# Patient Record
Sex: Female | Born: 1955 | Hispanic: Yes | Marital: Single | Smoking: Never smoker
Health system: Southern US, Community
[De-identification: ages and names within clinical notes are randomized; demographics above are authoritative.]

## PROBLEM LIST (undated history)

## (undated) DIAGNOSIS — M1990 Unspecified osteoarthritis, unspecified site: Secondary | ICD-10-CM

## (undated) DIAGNOSIS — E119 Type 2 diabetes mellitus without complications: Secondary | ICD-10-CM

## (undated) DIAGNOSIS — Z972 Presence of dental prosthetic device (complete) (partial): Secondary | ICD-10-CM

## (undated) DIAGNOSIS — E785 Hyperlipidemia, unspecified: Secondary | ICD-10-CM

## (undated) DIAGNOSIS — K4090 Unilateral inguinal hernia, without obstruction or gangrene, not specified as recurrent: Secondary | ICD-10-CM

## (undated) DIAGNOSIS — N8110 Cystocele, unspecified: Secondary | ICD-10-CM

## (undated) HISTORY — DX: Hyperlipidemia, unspecified: E78.5

## (undated) HISTORY — DX: Unspecified osteoarthritis, unspecified site: M19.90

## (undated) HISTORY — PX: BUNIONECTOMY: SHX129

## (undated) HISTORY — PX: BREAST SURGERY: SHX581

## (undated) HISTORY — DX: Cystocele, unspecified: N81.10

## (undated) HISTORY — DX: Type 2 diabetes mellitus without complications: E11.9

---

## 2006-08-27 HISTORY — PX: AUGMENTATION MAMMAPLASTY: SUR837

## 2009-08-31 ENCOUNTER — Ambulatory Visit: Payer: Self-pay | Admitting: Podiatry

## 2009-09-09 ENCOUNTER — Ambulatory Visit: Payer: Self-pay | Admitting: Podiatry

## 2009-12-26 ENCOUNTER — Ambulatory Visit: Payer: Self-pay | Admitting: Podiatry

## 2009-12-30 ENCOUNTER — Ambulatory Visit: Payer: Self-pay | Admitting: Podiatry

## 2011-10-10 ENCOUNTER — Ambulatory Visit: Payer: Self-pay

## 2011-11-02 DIAGNOSIS — N8111 Cystocele, midline: Secondary | ICD-10-CM | POA: Insufficient documentation

## 2011-11-02 DIAGNOSIS — N812 Incomplete uterovaginal prolapse: Secondary | ICD-10-CM | POA: Insufficient documentation

## 2011-11-02 DIAGNOSIS — N816 Rectocele: Secondary | ICD-10-CM | POA: Insufficient documentation

## 2011-11-02 DIAGNOSIS — N393 Stress incontinence (female) (male): Secondary | ICD-10-CM | POA: Insufficient documentation

## 2011-11-13 DIAGNOSIS — Z9889 Other specified postprocedural states: Secondary | ICD-10-CM | POA: Insufficient documentation

## 2011-12-28 DIAGNOSIS — Z09 Encounter for follow-up examination after completed treatment for conditions other than malignant neoplasm: Secondary | ICD-10-CM | POA: Insufficient documentation

## 2012-08-27 HISTORY — PX: ABDOMINAL HYSTERECTOMY: SHX81

## 2013-12-24 ENCOUNTER — Ambulatory Visit: Payer: Self-pay | Admitting: Obstetrics and Gynecology

## 2014-01-01 ENCOUNTER — Ambulatory Visit: Payer: Self-pay | Admitting: Obstetrics and Gynecology

## 2014-01-08 DIAGNOSIS — F32A Depression, unspecified: Secondary | ICD-10-CM | POA: Insufficient documentation

## 2014-01-08 DIAGNOSIS — M545 Low back pain, unspecified: Secondary | ICD-10-CM | POA: Insufficient documentation

## 2014-06-25 ENCOUNTER — Ambulatory Visit: Payer: Self-pay | Admitting: Orthopedic Surgery

## 2014-12-07 DIAGNOSIS — M1990 Unspecified osteoarthritis, unspecified site: Secondary | ICD-10-CM | POA: Insufficient documentation

## 2014-12-07 DIAGNOSIS — Z0000 Encounter for general adult medical examination without abnormal findings: Secondary | ICD-10-CM | POA: Insufficient documentation

## 2014-12-20 DIAGNOSIS — E785 Hyperlipidemia, unspecified: Secondary | ICD-10-CM | POA: Insufficient documentation

## 2015-01-25 DIAGNOSIS — M2550 Pain in unspecified joint: Secondary | ICD-10-CM | POA: Insufficient documentation

## 2015-02-01 DIAGNOSIS — G609 Hereditary and idiopathic neuropathy, unspecified: Secondary | ICD-10-CM | POA: Insufficient documentation

## 2015-02-02 ENCOUNTER — Ambulatory Visit: Payer: Self-pay

## 2015-02-02 ENCOUNTER — Ambulatory Visit: Payer: Self-pay | Attending: Oncology

## 2015-02-02 ENCOUNTER — Ambulatory Visit
Admission: RE | Admit: 2015-02-02 | Discharge: 2015-02-02 | Disposition: A | Discharge status: Home / Self Care | Payer: Self-pay | Source: Ambulatory Visit | Attending: Oncology | Admitting: Oncology

## 2015-02-02 ENCOUNTER — Other Ambulatory Visit: Payer: Self-pay | Admitting: Oncology

## 2015-02-02 VITALS — BP 116/76 | HR 70 | Temp 98.0°F | Resp 16

## 2015-02-02 DIAGNOSIS — Z0000 Encounter for general adult medical examination without abnormal findings: Secondary | ICD-10-CM

## 2015-02-02 NOTE — Progress Notes (Signed)
Subjective:     Patient ID: Leah Harris, female   DOB: July 03, 1956, 59 y.o.   MRN: [id number]  HPI   Review of Systems     Objective:   Physical Exam  Pulmonary/Chest: Right breast exhibits no inverted nipple, no mass, no nipple discharge, no skin change and no tenderness. Left breast exhibits no inverted nipple, no mass, no nipple discharge, no skin change and no tenderness. Breasts are symmetrical.    Bilateral breast implants       Assessment:    Patient presents for [place] [facility] visitPatient screened, and meets [facility] eligibility.  Patient does not have insurance, Medicare or Medicaid.  Handout given on Affordable Care Act.CBE unremarkable.  Patient has bilateral implants x 11 years.Instructed patient on breast self-exam using teach back method. Maritza Afandor interpreted exam.     Plan:

## 2015-02-03 ENCOUNTER — Other Ambulatory Visit: Payer: Self-pay | Admitting: Oncology

## 2015-02-03 ENCOUNTER — Other Ambulatory Visit: Payer: Self-pay

## 2015-02-03 DIAGNOSIS — R928 Other abnormal and inconclusive findings on diagnostic imaging of breast: Secondary | ICD-10-CM

## 2015-02-03 DIAGNOSIS — N63 Unspecified lump in unspecified breast: Secondary | ICD-10-CM

## 2015-02-10 ENCOUNTER — Other Ambulatory Visit: Payer: Self-pay | Admitting: *Deleted

## 2015-02-10 DIAGNOSIS — N63 Unspecified lump in unspecified breast: Secondary | ICD-10-CM

## 2015-02-16 ENCOUNTER — Ambulatory Visit
Admission: RE | Admit: 2015-02-16 | Discharge: 2015-02-16 | Disposition: A | Discharge status: Home / Self Care | Payer: Self-pay | Source: Ambulatory Visit | Attending: Oncology | Admitting: Oncology

## 2015-02-16 ENCOUNTER — Ambulatory Visit: Payer: Self-pay

## 2015-02-16 DIAGNOSIS — N63 Unspecified lump in unspecified breast: Secondary | ICD-10-CM

## 2015-02-16 NOTE — Progress Notes (Signed)
Letter mailed from [facility] to notify of normal mammogram results.  Patient to return in one year for annual screening.   To return in one year for screening.  Copy to [facility].

## 2015-03-03 ENCOUNTER — Ambulatory Visit
Admission: RE | Admit: 2015-03-03 | Discharge: 2015-03-03 | Disposition: A | Discharge status: Home / Self Care | Payer: Self-pay | Source: Ambulatory Visit | Attending: Physician Assistant | Admitting: Physician Assistant

## 2015-03-03 ENCOUNTER — Other Ambulatory Visit: Payer: Self-pay | Admitting: Physician Assistant

## 2015-03-03 DIAGNOSIS — M1990 Unspecified osteoarthritis, unspecified site: Secondary | ICD-10-CM

## 2015-03-03 DIAGNOSIS — M2550 Pain in unspecified joint: Secondary | ICD-10-CM

## 2015-03-03 DIAGNOSIS — M25562 Pain in left knee: Secondary | ICD-10-CM | POA: Insufficient documentation

## 2015-03-03 DIAGNOSIS — M1712 Unilateral primary osteoarthritis, left knee: Secondary | ICD-10-CM | POA: Insufficient documentation

## 2015-03-10 ENCOUNTER — Encounter: Payer: Self-pay | Admitting: Podiatry

## 2015-03-10 ENCOUNTER — Ambulatory Visit (INDEPENDENT_AMBULATORY_CARE_PROVIDER_SITE_OTHER): Payer: Self-pay | Admitting: Podiatry

## 2015-03-10 ENCOUNTER — Ambulatory Visit (INDEPENDENT_AMBULATORY_CARE_PROVIDER_SITE_OTHER): Payer: Self-pay

## 2015-03-10 VITALS — BP 114/62 | HR 78 | Resp 16

## 2015-03-10 DIAGNOSIS — M79673 Pain in unspecified foot: Secondary | ICD-10-CM

## 2015-03-10 DIAGNOSIS — G629 Polyneuropathy, unspecified: Secondary | ICD-10-CM

## 2015-03-10 NOTE — Progress Notes (Signed)
   Subjective:    Patient ID: Leah Harris, female    DOB: 1956-07-28, 59 y.o.   MRN: [id number]  HPI  59 year old female presents the office today with a Spanish interpreter for complaints of bilateral foot/leg pain. She describes a burning tight pain to both of her feet and legs. She states that her legs and feet do hurt when she walks although she has no pain at rest. She previously has been placed on gabapentin by her primary care physician. She is currently taking 300 mg 3 times a day. She denies any side effects the medication although she states is not helping that much. She states that most of her pain is at night. No other complaints at this time. She denies any history of injury or trauma. His been ongoing for several months and unchanged.   Review of Systems  All other systems reviewed and are negative.      Objective:   Physical Exam AAO x3, NAD DP/PT pulses palpable bilaterally, CRT less than 3 seconds Protective sensation intact with Simms Weinstein monofilament, vibratory sensation intact, Achilles tendon reflex intact. Subjectively there is numbness to both feet and distal legs.  No R no areas of tenderness to bilateral lower extremities at rest.  There is a decrease in medial arch height upon weightbearing. MMT 5/5, ROM WNL.  No open lesions or pre-ulcerative lesions.  No overlying edema, erythema, increase in warmth to bilateral lower extremities.  No pain with calf compression, swelling, warmth, erythema bilaterally.      Assessment & Plan:   59 year old female with what appears to be neuropathy type symptoms bilateral lower studies. There is no pain at today's appointment. -X-rays were obtained and reviewed with the patient.  -Treatment options discussed including all alternatives, risks, and complications -I discussed with the patient had increased her gabapentin slowly up to 600 mg 3 times a day. She verbally understood this. -She has apparently been checked  diabetes and other causes of neuropathy. Recommended  To follow-up with her primary care physician. -Follow-up 4 weeks or sooner if any problems arise. In the meantime, encouraged to call the office with any questions, concerns, change in symptoms.   Celesta Gentile, DPM

## 2015-03-17 ENCOUNTER — Ambulatory Visit
Admission: RE | Admit: 2015-03-17 | Discharge: 2015-03-17 | Disposition: A | Discharge status: Home / Self Care | Payer: Self-pay | Source: Ambulatory Visit | Attending: Family Medicine | Admitting: Family Medicine

## 2015-03-17 ENCOUNTER — Other Ambulatory Visit: Payer: Self-pay | Admitting: Family Medicine

## 2015-03-17 DIAGNOSIS — M2550 Pain in unspecified joint: Secondary | ICD-10-CM

## 2015-03-17 DIAGNOSIS — M25461 Effusion, right knee: Secondary | ICD-10-CM | POA: Insufficient documentation

## 2015-03-17 DIAGNOSIS — M1711 Unilateral primary osteoarthritis, right knee: Secondary | ICD-10-CM | POA: Insufficient documentation

## 2015-03-17 DIAGNOSIS — Z780 Asymptomatic menopausal state: Secondary | ICD-10-CM | POA: Insufficient documentation

## 2015-03-17 DIAGNOSIS — M8580 Other specified disorders of bone density and structure, unspecified site: Secondary | ICD-10-CM | POA: Insufficient documentation

## 2015-03-30 ENCOUNTER — Encounter: Payer: Self-pay | Admitting: *Deleted

## 2015-03-31 DIAGNOSIS — R600 Localized edema: Secondary | ICD-10-CM | POA: Insufficient documentation

## 2015-04-07 ENCOUNTER — Encounter: Payer: Self-pay | Admitting: Neurology

## 2015-04-07 ENCOUNTER — Ambulatory Visit (INDEPENDENT_AMBULATORY_CARE_PROVIDER_SITE_OTHER): Payer: Self-pay | Admitting: Neurology

## 2015-04-07 ENCOUNTER — Ambulatory Visit: Payer: Self-pay | Admitting: Podiatry

## 2015-04-07 VITALS — BP 110/70 | HR 82 | Ht 59.0 in | Wt 164.6 lb

## 2015-04-07 DIAGNOSIS — M25562 Pain in left knee: Secondary | ICD-10-CM

## 2015-04-07 DIAGNOSIS — M79605 Pain in left leg: Secondary | ICD-10-CM

## 2015-04-07 DIAGNOSIS — M79604 Pain in right leg: Secondary | ICD-10-CM

## 2015-04-07 NOTE — Patient Instructions (Addendum)
Your symptoms and exam does NOT suggest a problem of your nervous system.  For this reason, the medication gabapentin is probably not the best for your pain.  Since gabapentin is not helping, reduce to 600mg  twice daily for 3 days, then 600mg  at bedtime for three days then STOP.  Please follow-up with your orthopaedic surgeon for evaluation of knee pain, as pain is most likely referred from this.

## 2015-04-07 NOTE — Progress Notes (Signed)
[facility] Clinic Note - Initial Visit   Date: 04/07/2015  Leah Harris MRN: [id number] DOB: 1956/02/15   Dear Dr. Carlynn Herald:  Thank you for your kind referral of Leah Harris for consultation of bilateral feet paresthesias. Although her history is well known to you, please allow [place] to reiterate it for the purpose of our medical record. The patient was accompanied to the clinic by Spanish Interpretor.    History of Present Illness: Leah Harris is a 59 y.o. right-handed Hispanic female with bilateral leg edema and knee pain presenting for evaluation of bilateral leg pain.    Since early 2016, she started experiencing swelling and severe pain of the left knee.  She feels as if the knee bones are grinding on each other when she is walking.  Pain is worse when she is walking or at night time.  She has burning and achy sensation of the entire lower leg, sparing the feet.  Rest improves her pain and swelling.  She has pain with bending her knees and on the left side, she is unable to flex the knee completely.  She tends to favor the right leg when walking because of her knee pain, but lately has been experiencing similar pain on right, too.  She has seen an orthopeadic physician who mentioned that her cartilage is reduced which is contributing to her discomfort.  She had no numbness/tingling, electrical/shock-like pain of her legs.  Denies any feet pain, paresthesias, or weakness. She walks independently and has not had any falls.   She was started on gabapentin and increased to 600mg  TID for her burning pain, but there has been no relief of symptoms, but it does make her sleepy.      Out-side paper records, electronic medical record, and images have been reviewed where available and summarized as:  XR knee left 03/03/2015: 1. No acute findings. 2. 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  No past medical history on file.  Past Surgical History  Procedure Laterality Date  . Augmentation mammaplasty Bilateral 2008    saline     Medications:  Outpatient Encounter Prescriptions as of 04/07/2015  Medication Sig Note  . naproxen (NAPROSYN) 500 MG tablet Take 500 mg by mouth 2 (two) times daily with a meal.   . acetaminophen (TYLENOL) 500 MG tablet Take by mouth. 03/10/2015: Received from: [place]  . furosemide (LASIX) 20 MG tablet Take by mouth. 03/10/2015: Received from: [place]  . gabapentin (NEURONTIN) 300 MG capsule Take by mouth 3 (three) times daily.  03/10/2015: Received from: [place]  . [DISCONTINUED] atorvastatin (LIPITOR) 10 MG tablet Take by mouth. 03/10/2015: Received from: [place]   No facility-administered encounter medications on file as of 04/07/2015.     Allergies: No Known Allergies  Family History: Family History  Problem Relation Age of Onset  . Hypertension Mother     Social History: Social History  Substance Use Topics  . Smoking status: Never Smoker   . Smokeless tobacco: Never Used  . Alcohol Use: No   Social History   Social History Narrative   Lives alone in a one story home.  Has 7 children.  Works as a Training and development officer in [employer].  Education: 4th grade.    Review of Systems:  CONSTITUTIONAL: No fevers, chills, night sweats, or weight loss.   EYES: No visual changes or eye pain ENT: No hearing changes.  No history of nose bleeds.   RESPIRATORY: No cough, wheezing and shortness of breath.   CARDIOVASCULAR: Negative for chest pain, and palpitations.   GI: Negative for abdominal discomfort, blood in stools or black stools.  No recent change in bowel habits.   GU:  No history of incontinence.   MUSCLOSKELETAL: +history of joint pain or swelling.  No myalgias.   SKIN: Negative for lesions, rash, and itching.   HEMATOLOGY/ONCOLOGY: Negative for  prolonged bleeding, bruising easily, and swollen nodes.  No history of cancer.   ENDOCRINE: Negative for cold or heat intolerance, polydipsia or goiter.   PSYCH:  No depression or anxiety symptoms.   NEURO: As Above.   Vital Signs:  BP 110/70 mmHg  Pulse 82  Ht 4\' 11"  (1.499 m)  Wt 164 lb 9 oz (74.645 kg)  BMI 33.22 kg/m2  SpO2 95%  LMP  (LMP Unknown)   General Medical Exam:   General:  Well appearing, comfortable.   Eyes/ENT: see cranial nerve examination.   Neck: No masses appreciated.  Full range of motion without tenderness.  No carotid bruits. Respiratory:  Clear to auscultation, good air entry bilaterally.   Cardiac:  Regular rate and rhythm, no murmur.   Extremities:  Mild edema of the knee on the left and bilateral lower legs.  Knee flexion ROM is reduced on the left.   Skin:  No rashes or lesions.  Neurological Exam: MENTAL STATUS including orientation to time, place, person, recent and remote memory, attention span and concentration, language, and fund of knowledge is normal.  Speech is not dysarthric, able to speak broken english.  CRANIAL NERVES: II:  No visual field defects.  Unremarkable fundi.   III-IV-VI: Pupils equal round and reactive to light.  Normal conjugate, extra-ocular eye movements in all directions of gaze.  No nystagmus.  No ptosis.   V:  Normal facial sensation.     VII:  Normal facial symmetry and movements.  VIII:  Normal hearing and vestibular function.   IX-X:  Normal palatal movement.   XI:  Normal shoulder shrug and head rotation.   XII:  Normal tongue strength and range of motion, no deviation or fasciculation.  MOTOR:  No atrophy, fasciculations or abnormal movements.  No pronator drift.  Tone is normal.    Right Upper Extremity:    Left Upper Extremity:    Deltoid  5/5   Deltoid  5/5   Biceps  5/5   Biceps  5/5   Triceps  5/5   Triceps  5/5   Wrist extensors  5/5   Wrist extensors  5/5   Wrist flexors  5/5   Wrist flexors  5/5     Finger extensors  5/5   Finger extensors  5/5   Finger flexors  5/5   Finger flexors  5/5   Dorsal interossei  5/5   Dorsal interossei  5/5   Abductor pollicis  5/5   Abductor pollicis  5/5   Tone (Ashworth scale)  0  Tone (Ashworth scale)  0   Right Lower Extremity:    Left Lower Extremity:    Hip flexors  5/5   Hip flexors  5/5   Hip extensors  5/5   Hip extensors  5/5   Knee flexors  5/5   Knee flexors  5/5   Knee extensors  5/5   Knee extensors  5/5   Dorsiflexors  5/5   Dorsiflexors  5/5   Plantarflexors  5/5   Plantarflexors  5/5   Toe extensors  5/5   Toe extensors  5/5   Toe flexors  5/5   Toe flexors  5/5   Tone (Ashworth scale)  0  Tone (Ashworth scale)  0   MSRs:  Right                                                                 Left brachioradialis 2+  brachioradialis 2+  biceps 2+  biceps 2+  triceps 2+  triceps 2+  patellar 2+  patellar 2+  ankle jerk 1+  ankle jerk 1+  Hoffman no  Hoffman no  plantar response down  plantar response down   SENSORY:  Normal and symmetric perception of light touch, pinprick, vibration, and proprioception.  Romberg's sign absent.   COORDINATION/GAIT: Normal finger-to- nose-finger and heel-to-shin.  Intact rapid alternating movements bilaterally.  Gait is antalgic favoring the right leg.   IMPRESSION: Ms. Ficken is a 59 year-old female presenting for evaluation of bilateral leg pain.  Pain is achy involves the lower leg from the knee down to the ankle and spares the feet, worse on the left.  I do not find any evidence of neuropathy or radiculopathy based on her history or exam and do not feel that NCS/EMG is warranted. It would be extremely unusual for neuropathy to spare the feet and further her sensation to all modalities is intact.  Because she does not have neuropathic pain, I would recommend tapering the gabapentin to stop it.    Her pain is most likely orthopeadic in origin and referred down her leg.  She has marked  reduced range of motion with left leg flexion.  Alternatively, the burning bone pain after walking, may be suggestive of claudication.  She needs to follow-up with her orthopaedic physician for her knee pain.  I further recommended that she use a cane to off set her weight when walking as she is favoring the right leg to prevent further injury to the right knee.   Return to clinic as needed   The duration of this appointment visit was 35 minutes of face-to-face time with the patient.  Greater than 50% of this time was spent in counseling, explanation of diagnosis, planning of further management, and coordination of care.   Thank you for allowing me to participate in patient's care.  If I can answer any additional questions, I would be pleased to do so.    Sincerely,    Allecia Bells K. Posey Pronto, DO

## 2015-04-07 NOTE — Progress Notes (Signed)
Note faxed.

## 2015-05-11 DIAGNOSIS — M170 Bilateral primary osteoarthritis of knee: Secondary | ICD-10-CM | POA: Insufficient documentation

## 2015-10-14 IMAGING — CR DG KNEE COMPLETE 4+V*L*
1 series · 4 of 4 positions shown · non-contrast
Comparison: None.

CLINICAL DATA: Left knee pain for the past 2 years. Worsening
recently. No known injury. Initial encounter.

EXAM:
LEFT KNEE - COMPLETE 4+ VIEW

[Series 1: dg knee complete 4 views left · 0.14mm/px · 4 of 4 slices shown]
[im 1/4]
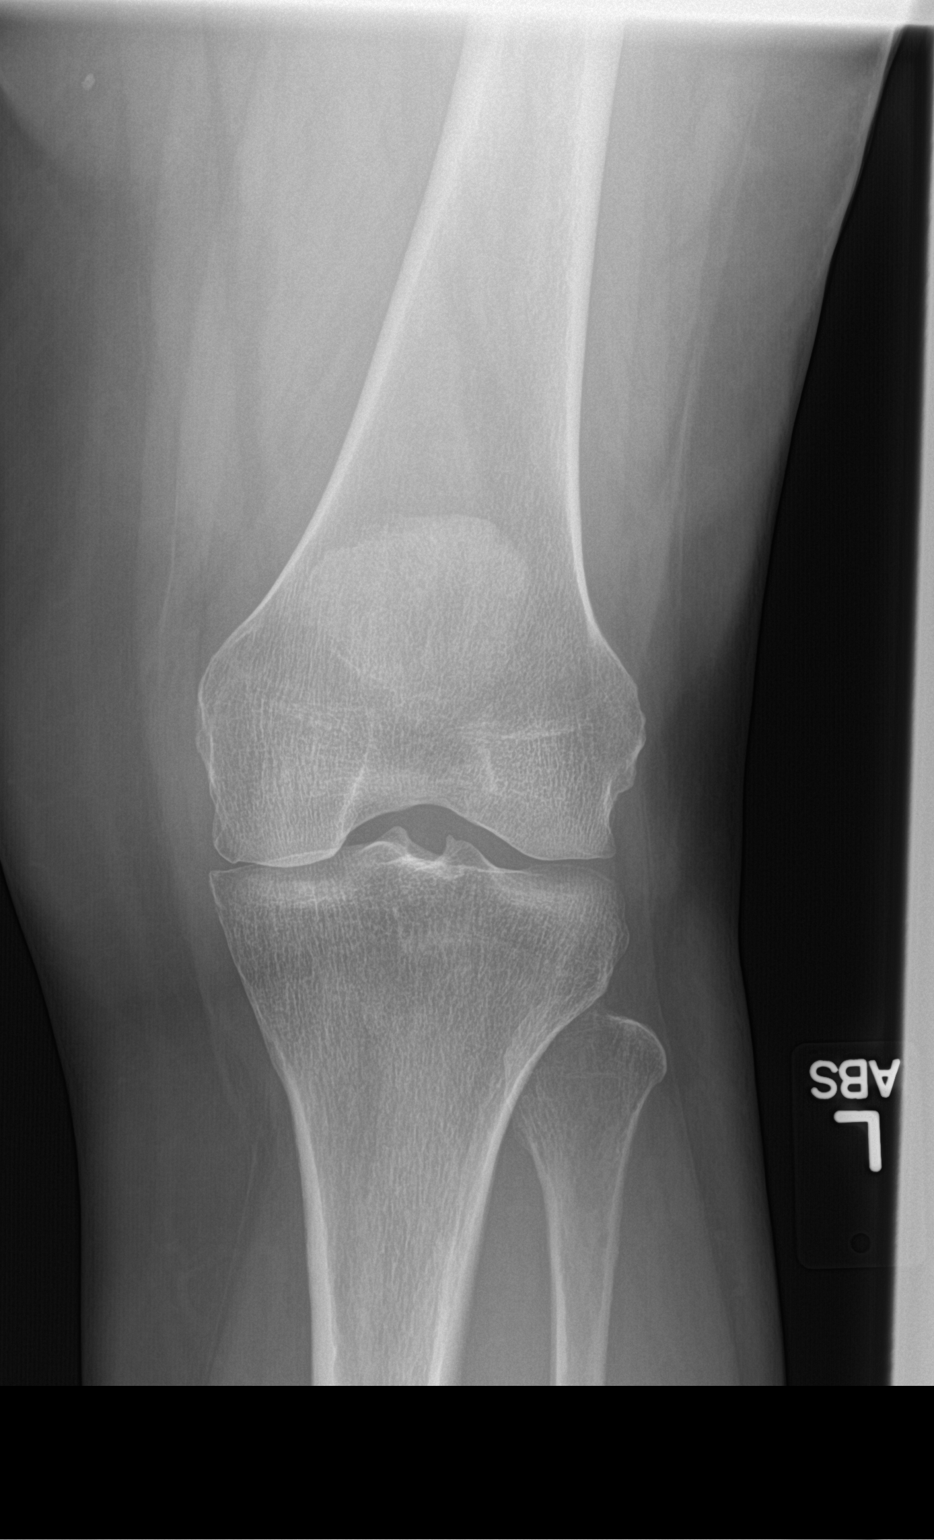
[im 2/4]
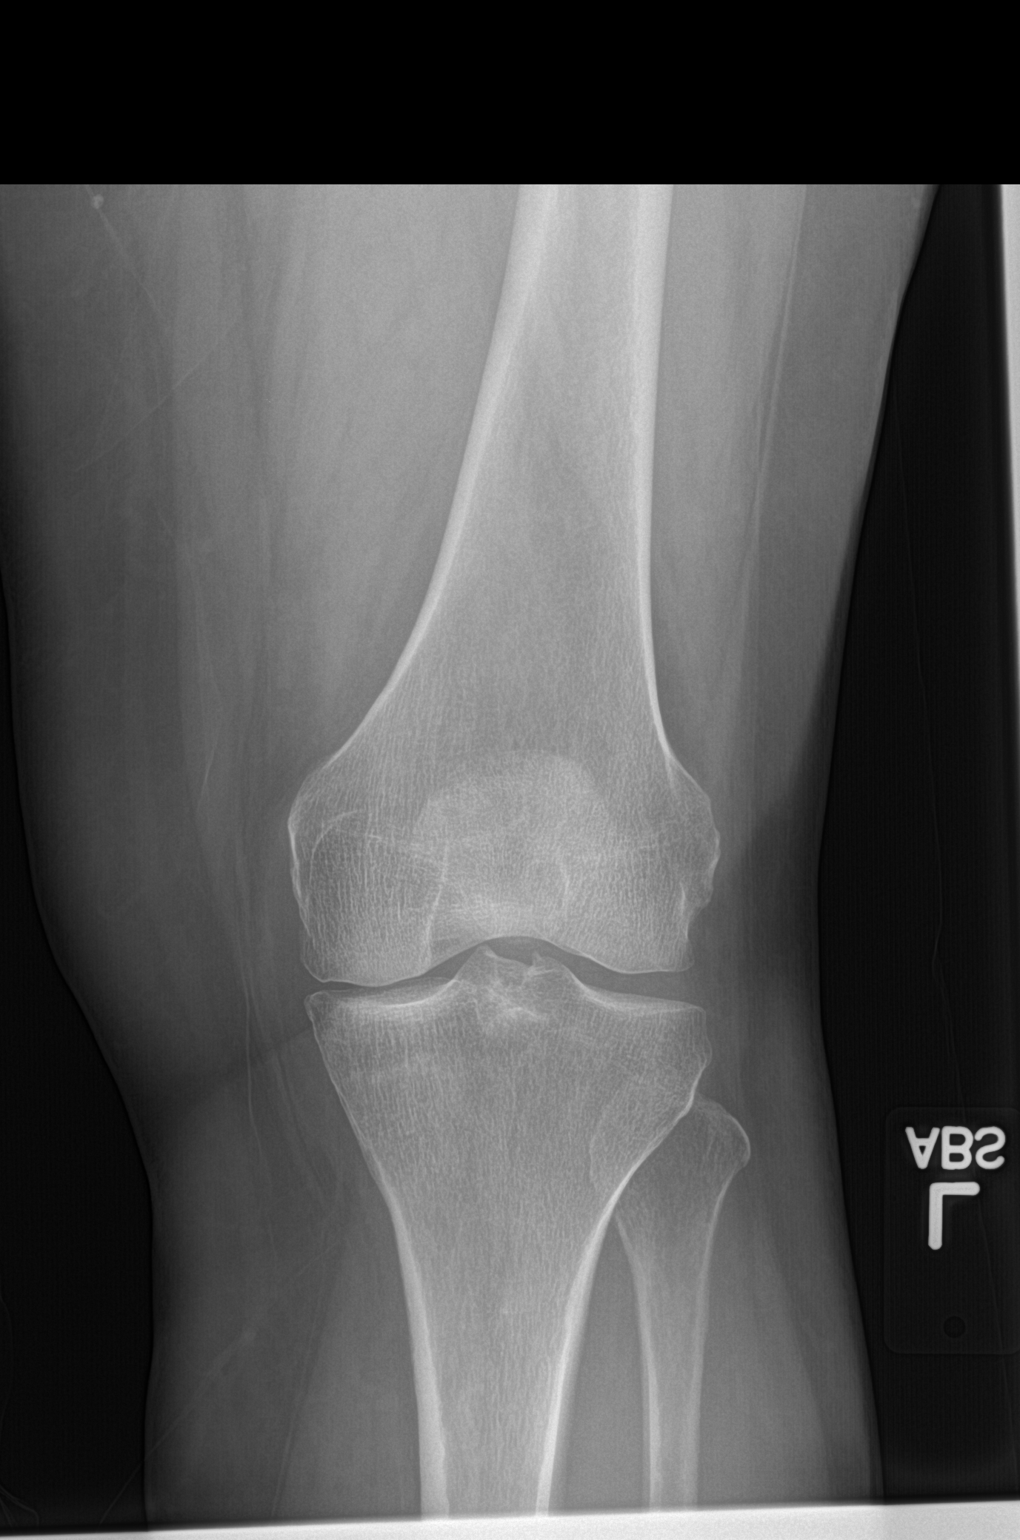
[im 3/4]
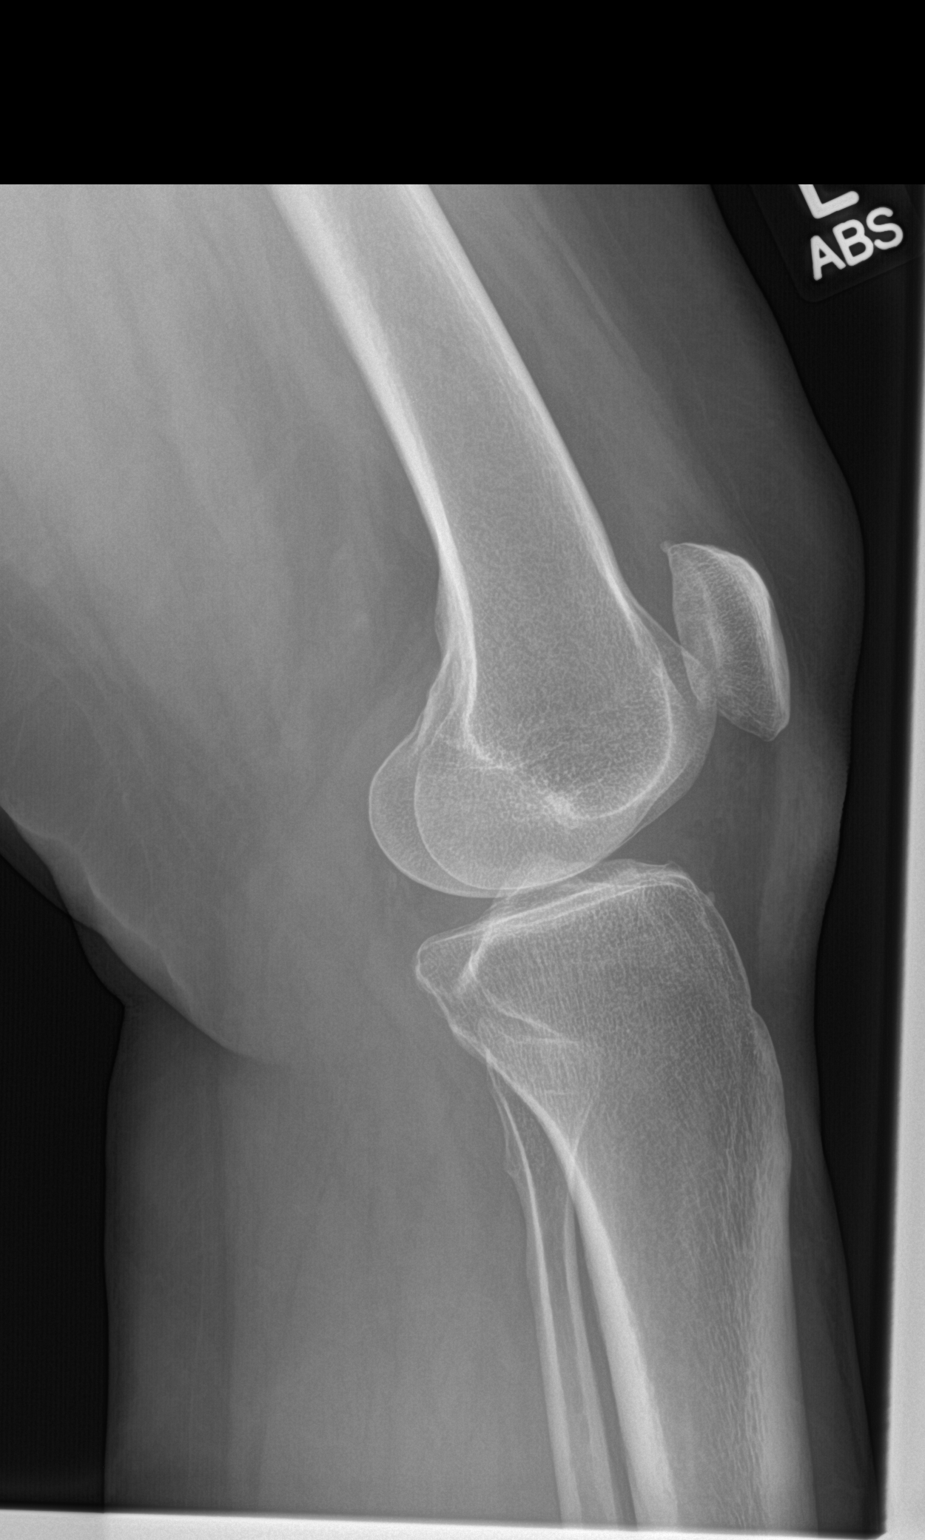
[im 4/4]
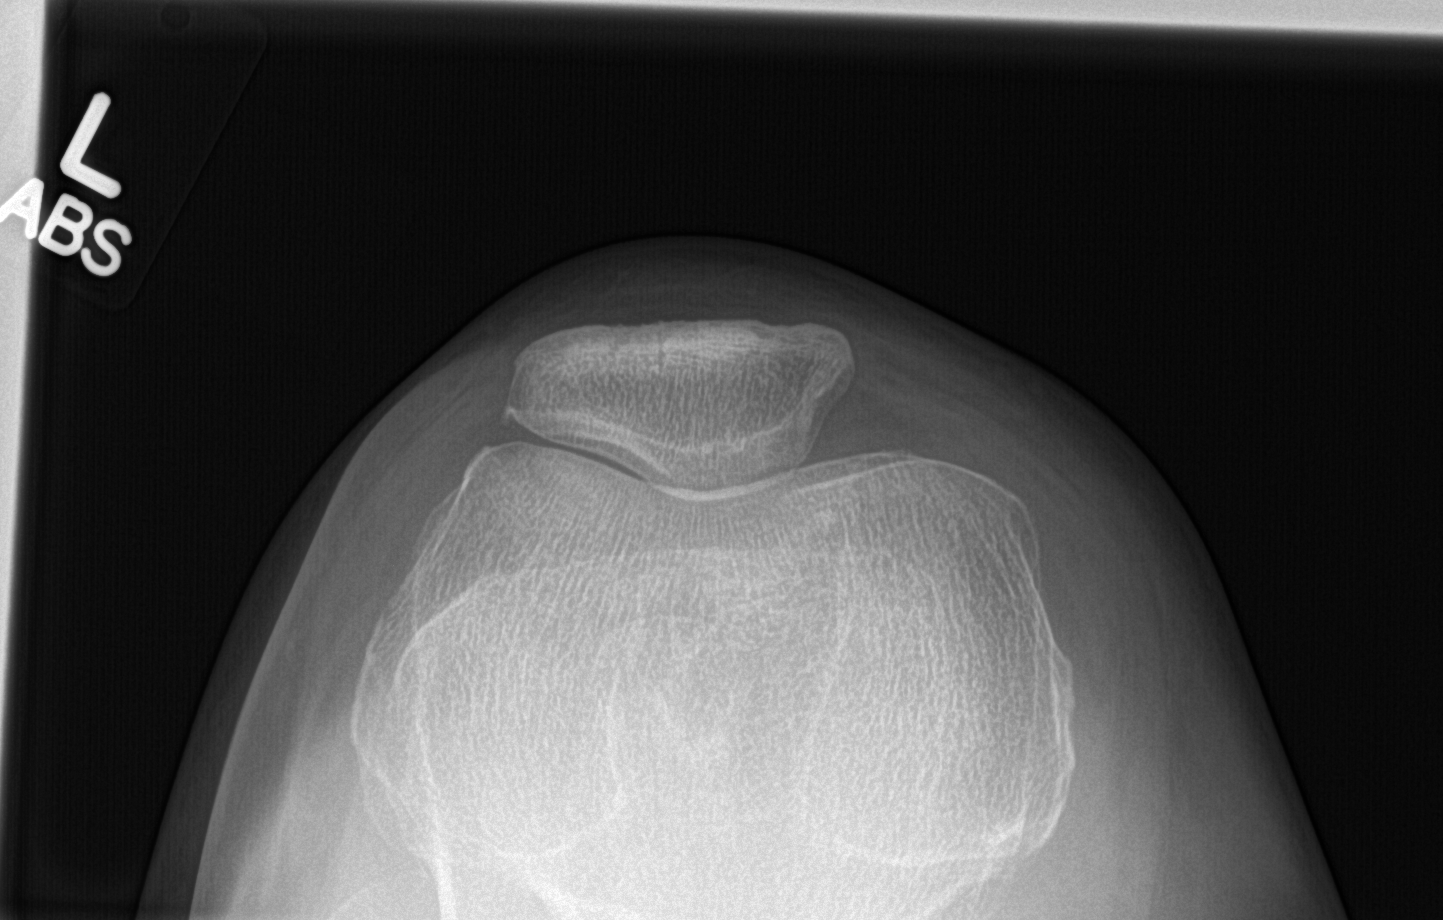

[4 of 4 positions shown; findings below may reference images not displayed]

FINDINGS: No fracture or dislocation mild-to-moderate tricompartmental
degenerative change of the knee, worse within the medial compartment
with joint space loss, subchondral sclerosis osteophytosis. There is
minimal spurring the tibial spines. No evidence of
chondrocalcinosis. There is an enthesopathic change involving the
superior pole the patella. There is a well-circumscribed punctate
(approximately 2 mm) presumed loose body overlying the anterior
inferior aspect of the knee joint space without a discrete donor
site identified. No joint effusion. A punctate dermal calcification
overlies the medial aspect of the mid thigh. Regional soft tissues
appear normal.
IMPRESSION: 1. No acute findings.
2. Mild-to-moderate tricompartmental degenerative change of the
knee, worse within the medial compartment.

## 2015-10-28 IMAGING — CR DG KNEE COMPLETE 4+V*R*
4 series · 4 of 4 positions shown · non-contrast
Comparison: None.

CLINICAL DATA: Knee pain.  No known injury.  Initial evaluation.

EXAM:
RIGHT KNEE - COMPLETE 4+ VIEW

[knee ap (1 of 2)]
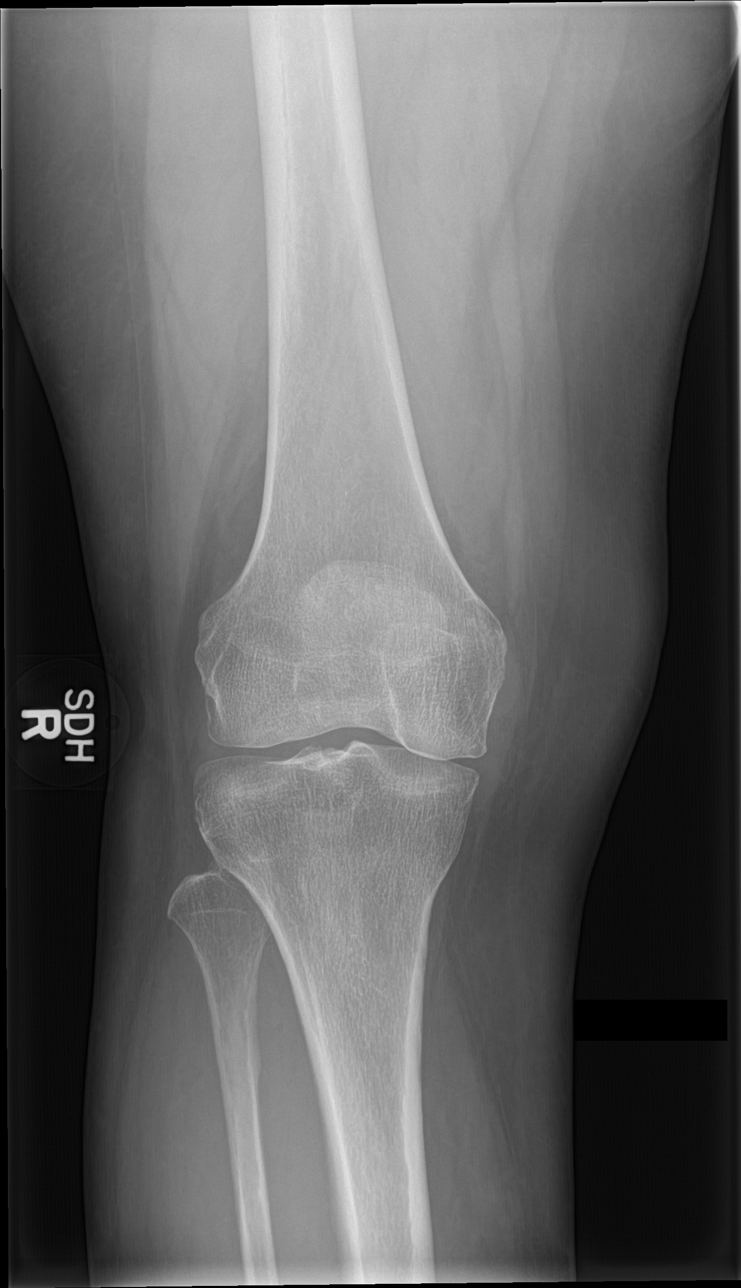

[knee lat]
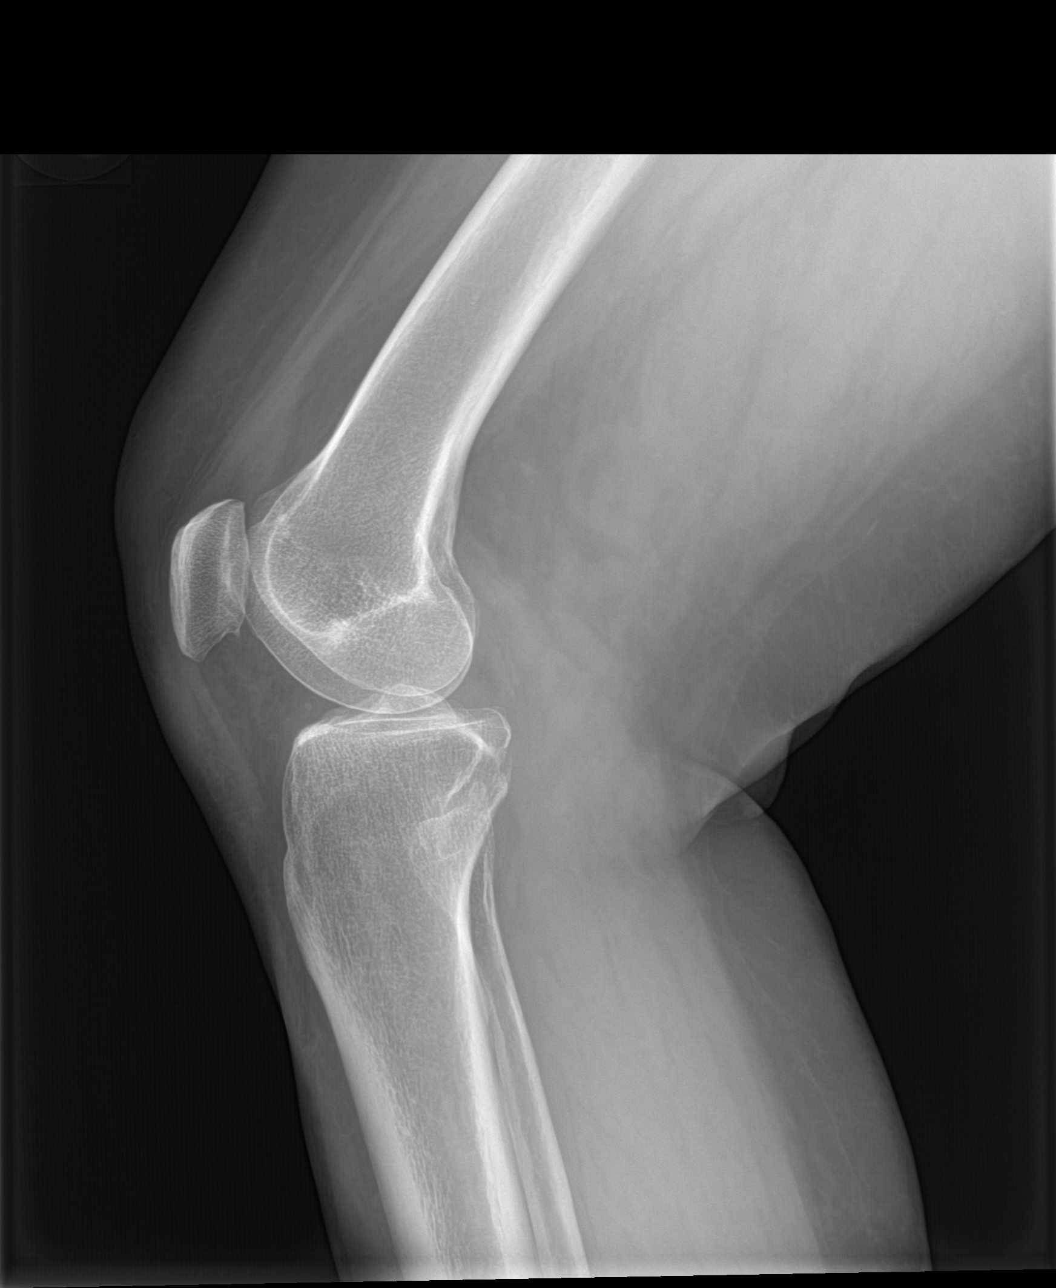

[sunrise]
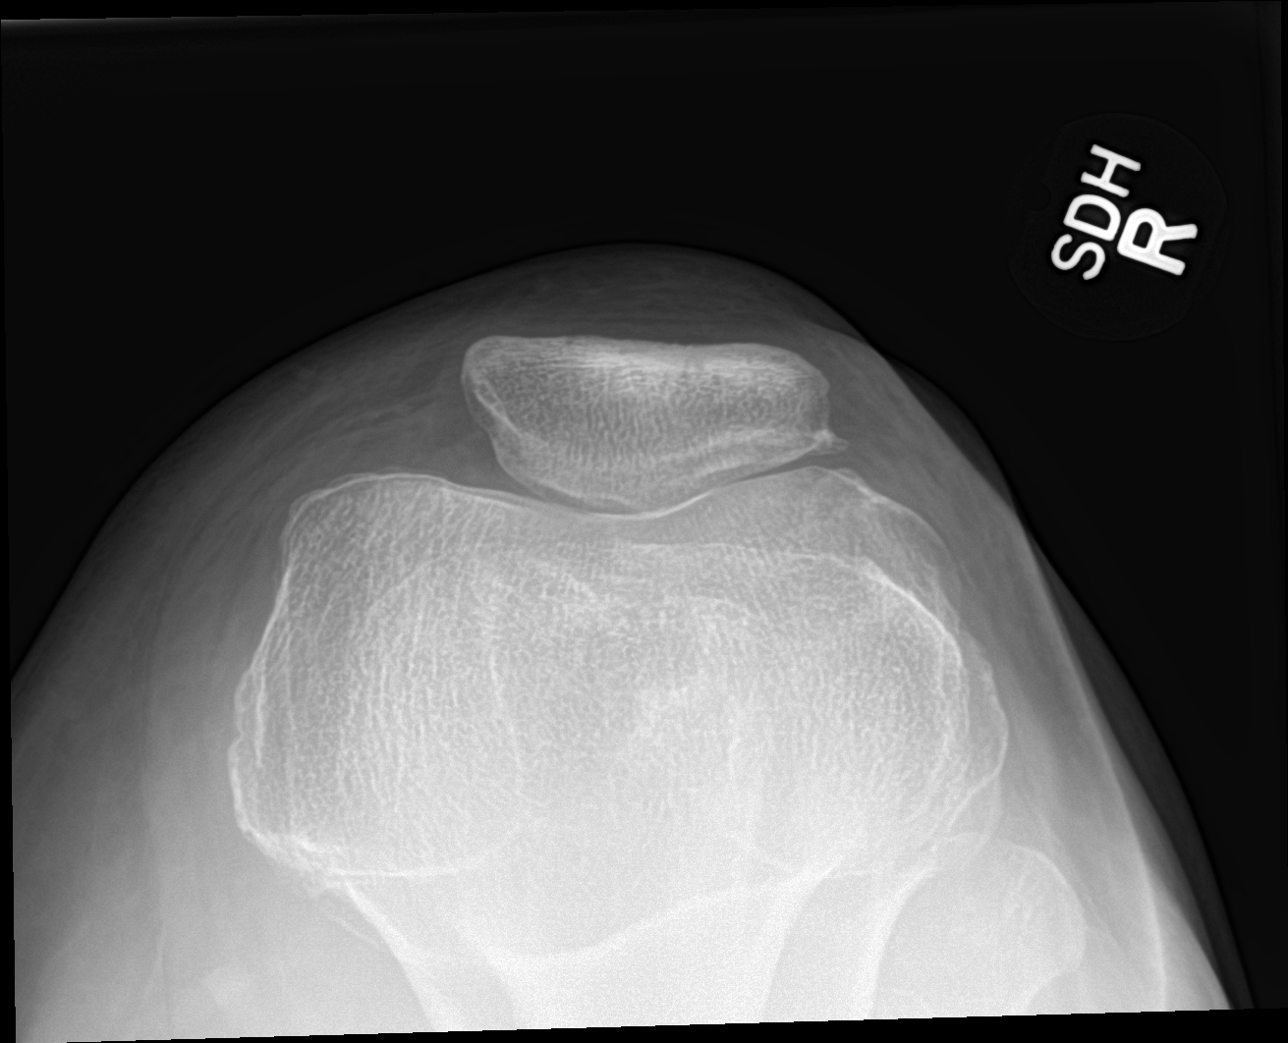

[knee ap (2 of 2)]
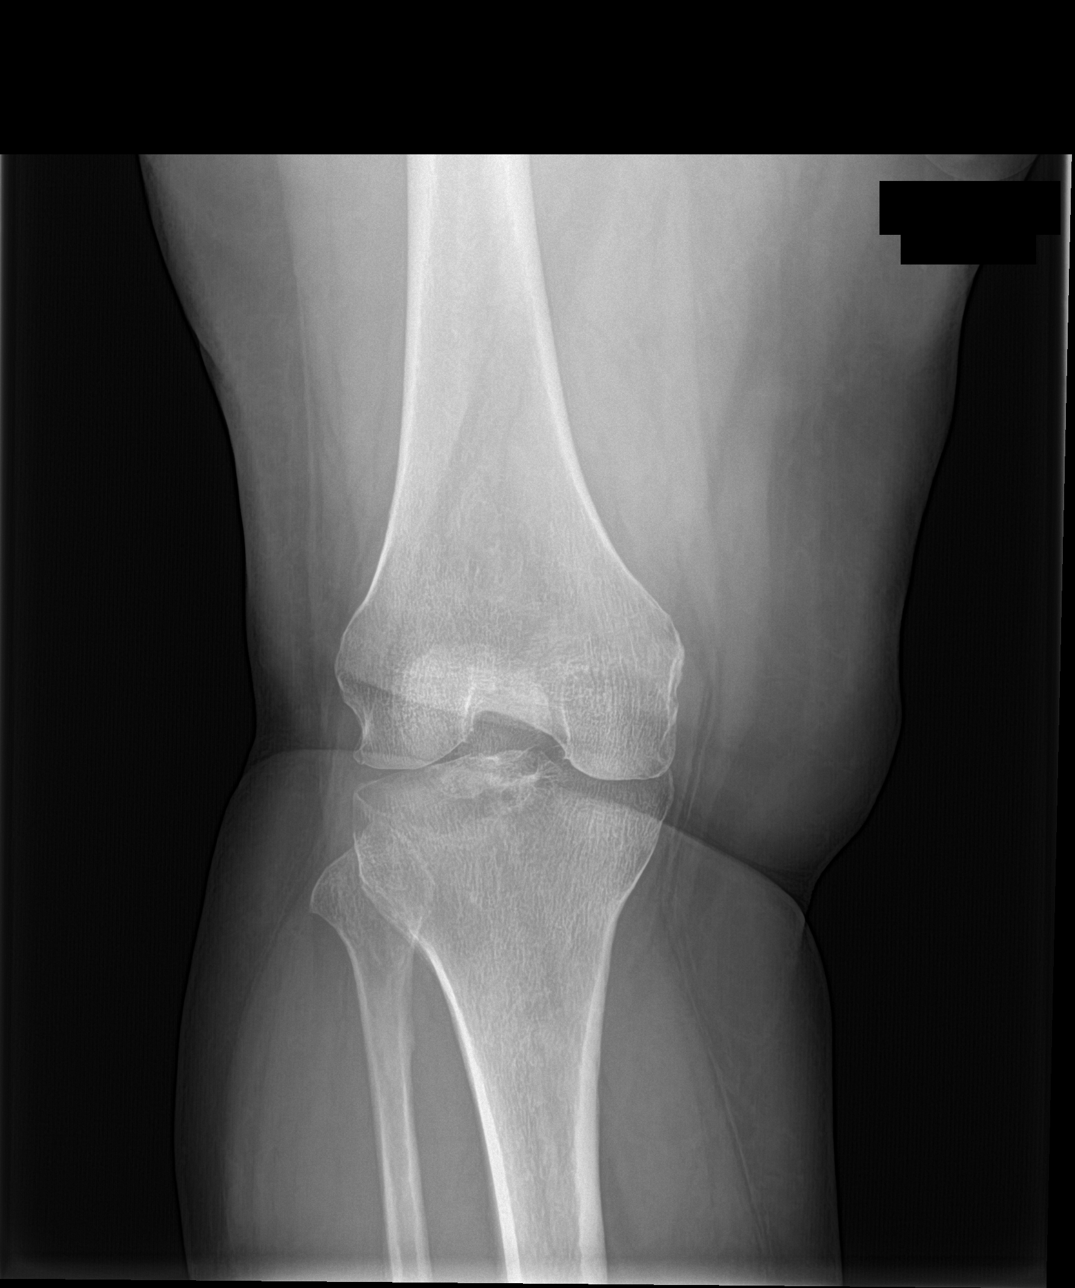

[4 of 4 positions shown; findings below may reference images not displayed]

FINDINGS: Diffuse osteopenia. Mild tricompartment degenerative change present.
Degenerative changes most prominent about the patellofemoral
compartment. No acute bony abnormality identified. No evidence of
fracture or dislocation. Tiny knee joint effusion cannot be
excluded.
IMPRESSION: Diffuse osteopenia and mild tricompartment degenerative change. No
acute bony abnormality. Tiny knee joint effusion cannot be excluded.

## 2016-02-15 ENCOUNTER — Ambulatory Visit: Payer: Self-pay

## 2016-02-22 ENCOUNTER — Ambulatory Visit: Payer: Self-pay | Attending: Internal Medicine

## 2016-05-18 ENCOUNTER — Emergency Department (HOSPITAL_COMMUNITY)
Admission: EM | Admit: 2016-05-18 | Discharge: 2016-05-19 | Disposition: A | Discharge status: Home / Self Care | Payer: Self-pay | Attending: Emergency Medicine | Admitting: Emergency Medicine

## 2016-05-18 ENCOUNTER — Encounter (HOSPITAL_COMMUNITY): Payer: Self-pay | Admitting: Emergency Medicine

## 2016-05-18 DIAGNOSIS — S93601A Unspecified sprain of right foot, initial encounter: Secondary | ICD-10-CM | POA: Insufficient documentation

## 2016-05-18 DIAGNOSIS — S8251XA Displaced fracture of medial malleolus of right tibia, initial encounter for closed fracture: Secondary | ICD-10-CM | POA: Insufficient documentation

## 2016-05-18 DIAGNOSIS — Y9289 Other specified places as the place of occurrence of the external cause: Secondary | ICD-10-CM | POA: Insufficient documentation

## 2016-05-18 DIAGNOSIS — Y999 Unspecified external cause status: Secondary | ICD-10-CM | POA: Insufficient documentation

## 2016-05-18 DIAGNOSIS — Y939 Activity, unspecified: Secondary | ICD-10-CM | POA: Insufficient documentation

## 2016-05-18 MED ORDER — HYDROMORPHONE HCL 1 MG/ML IJ SOLN
1.0000 mg | Freq: Once | INTRAMUSCULAR | Status: AC
  Administered 2016-05-18: 1 mg via INTRAVENOUS
  Filled 2016-05-18: qty 1
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
## 2016-05-19 ENCOUNTER — Emergency Department (HOSPITAL_COMMUNITY): Payer: Self-pay

## 2016-05-19 MED ORDER — HYDROCODONE-ACETAMINOPHEN 5-325 MG PO TABS: 1.0000 | ORAL_TABLET | ORAL | 0 refills | Status: DC | PRN

## 2016-05-19 NOTE — ED Notes (Signed)
Patient transported to X-ray 

## 2016-05-19 NOTE — ED Notes (Signed)
Ortho tech at bedside 

## 2016-05-19 NOTE — ED Notes (Signed)
Pt given paper scrub bottoms

## 2016-05-19 NOTE — ED Notes (Signed)
Ortho paged for splint 

## 2018-02-19 ENCOUNTER — Ambulatory Visit
Admission: RE | Admit: 2018-02-19 | Discharge: 2018-02-19 | Disposition: A | Discharge status: Home / Self Care | Payer: Self-pay | Source: Ambulatory Visit | Attending: Oncology | Admitting: Oncology

## 2018-02-19 ENCOUNTER — Ambulatory Visit: Payer: Self-pay | Attending: Oncology | Admitting: *Deleted

## 2018-02-19 ENCOUNTER — Other Ambulatory Visit: Payer: Self-pay | Admitting: *Deleted

## 2018-02-19 VITALS — BP 119/77 | HR 73 | Temp 98.1°F | Ht 60.0 in | Wt 166.0 lb

## 2018-02-19 DIAGNOSIS — Z0000 Encounter for general adult medical examination without abnormal findings: Secondary | ICD-10-CM | POA: Insufficient documentation

## 2018-02-19 NOTE — Patient Instructions (Signed)
Gave patient hand-out, Women Staying Healthy, Active and Well from [facility], with education on breast health, pap smears, heart and colon health. 

## 2018-02-19 NOTE — Progress Notes (Signed)
  Subjective:     Patient ID: Leah Harris, female   DOB: December 16, 1955, 62 y.o.   MRN: [id number]  Presents for breast exam and mammogram.  Review of Systems     Objective:   Physical Exam  Pulmonary/Chest:         Assessment:     62 year old [place] female presents to [facility] for clinical breast exam and mammogram.  Lloyda, the interpreter present during the interview and exam.  Clinical breast exam reveals bilateral breast to implants.  Patient states they were put in about 13 years ago in [place].  Taught self breast awareness.  Patient with a history of hysterectomy.  No pap per protocol.  Patient has been screened for eligibility.  She does not have any insurance, Medicare or Medicaid.  She also meets financial eligibility.  Hand-out given on the Affordable Care Act.    Plan:     Screening mammogram ordered.  Will follow-up per BCCCP protocol.

## 2018-02-25 ENCOUNTER — Encounter: Payer: Self-pay | Admitting: *Deleted

## 2018-02-25 NOTE — Progress Notes (Signed)
Letter mailed from the [facility] to inform patient of her normal mammogram results.  Patient is to follow-up with annual screening in one year.  HSIS to [place]. 

## 2019-07-17 ENCOUNTER — Other Ambulatory Visit: Payer: Self-pay

## 2019-07-18 ENCOUNTER — Other Ambulatory Visit: Payer: Self-pay

## 2019-07-18 DIAGNOSIS — Z0000 Encounter for general adult medical examination without abnormal findings: Secondary | ICD-10-CM

## 2019-07-21 ENCOUNTER — Other Ambulatory Visit: Payer: Self-pay

## 2019-07-21 ENCOUNTER — Ambulatory Visit: Payer: Self-pay | Attending: Oncology | Admitting: *Deleted

## 2019-07-21 ENCOUNTER — Ambulatory Visit
Admission: RE | Admit: 2019-07-21 | Discharge: 2019-07-21 | Disposition: A | Discharge status: Home / Self Care | Payer: Self-pay | Source: Ambulatory Visit | Attending: Oncology | Admitting: Oncology

## 2019-07-21 ENCOUNTER — Encounter: Payer: Self-pay | Admitting: *Deleted

## 2019-07-21 DIAGNOSIS — Z0000 Encounter for general adult medical examination without abnormal findings: Secondary | ICD-10-CM

## 2019-07-21 NOTE — Progress Notes (Signed)
Due to Covid 19 a televisit was used to collect patient's health history.  Leah Harris completed verbal consent and reassessed eligibility.  Patient has a history of hysterectomy and denied any breast problems at this time.  She was referred to the [facility] for her screening mammogram.  She will follow per BCCCP protocol.  See [name] risk assessment below. Risk Assessment    No risk assessment data for the current encounter   Risk Scores      07/17/2019   Last edited by: Orson Slick, CMA   5-year risk: 0.7 %   Lifetime risk: 3.4 %

## 2019-07-21 NOTE — Progress Notes (Signed)
Letter mailed from the [facility] to inform patient of her normal mammogram results.  Patient is to follow-up with annual screening in one year.  HSIS to [place]. 

## 2019-09-24 ENCOUNTER — Ambulatory Visit
Admission: RE | Admit: 2019-09-24 | Discharge: 2019-09-24 | Disposition: A | Discharge status: Home / Self Care | Payer: Self-pay | Attending: Family Medicine | Admitting: Family Medicine

## 2019-09-24 ENCOUNTER — Other Ambulatory Visit: Payer: Self-pay

## 2019-09-24 ENCOUNTER — Ambulatory Visit
Admission: RE | Admit: 2019-09-24 | Discharge: 2019-09-24 | Disposition: A | Discharge status: Home / Self Care | Payer: Self-pay | Source: Ambulatory Visit | Attending: Family Medicine | Admitting: Family Medicine

## 2019-09-24 ENCOUNTER — Other Ambulatory Visit: Payer: Self-pay | Admitting: Family Medicine

## 2019-09-24 DIAGNOSIS — G8929 Other chronic pain: Secondary | ICD-10-CM

## 2019-09-24 DIAGNOSIS — M545 Low back pain, unspecified: Secondary | ICD-10-CM

## 2020-07-26 ENCOUNTER — Ambulatory Visit
Admission: RE | Admit: 2020-07-26 | Discharge: 2020-07-26 | Disposition: A | Discharge status: Home / Self Care | Payer: Self-pay | Source: Ambulatory Visit | Attending: Oncology | Admitting: Oncology

## 2020-07-26 ENCOUNTER — Ambulatory Visit: Payer: Self-pay | Attending: Oncology

## 2020-07-26 ENCOUNTER — Other Ambulatory Visit: Payer: Self-pay

## 2020-07-26 VITALS — BP 116/72 | HR 65 | Temp 97.4°F | Ht 59.0 in | Wt 164.0 lb

## 2020-07-26 DIAGNOSIS — Z0000 Encounter for general adult medical examination without abnormal findings: Secondary | ICD-10-CM

## 2020-07-26 NOTE — Progress Notes (Signed)
  Subjective:     Patient ID: Leah Harris, female   DOB: 08/22/1956, 64 y.o.   MRN: [id number]  HPI   Review of Systems     Objective:   Physical Exam Chest:     Breasts:        Right: No swelling, bleeding, inverted nipple, mass, nipple discharge, skin change or tenderness.        Left: No swelling, bleeding, inverted nipple, mass, nipple discharge, skin change or tenderness.       Comments: Bilateral breast implants       Assessment:     64 year old Hispanic patient returns for BCCCP screening.  History of bilateral breast implants, and partial hysterectomy. Leah Harris from [facility] interpreted exam.   Patient screened, and meets BCCCP eligibility.  Patient does not have insurance, Medicare or Medicaid. Instructed patient on breast self awareness using teach back method. Clinical breast exam unremarkable. Risk Assessment    Risk Scores      07/26/2020 07/17/2019   Last edited by: Leah Junker, RN [place], Leah Harris, CMA   5-year risk: 0.7 % 0.7 %   Lifetime risk: 3 % 3.4 %            Plan:     Sent for bilateral screening mammogram.

## 2020-07-27 NOTE — Progress Notes (Signed)
Letter mailed from [facility] to notify of normal mammogram results.  Patient to return in one year for annual screening.  Copy to HSIS. 

## 2020-11-25 HISTORY — PX: AUGMENTATION MAMMAPLASTY: SUR837

## 2021-05-25 DIAGNOSIS — R351 Nocturia: Secondary | ICD-10-CM | POA: Insufficient documentation

## 2021-05-25 DIAGNOSIS — N958 Other specified menopausal and perimenopausal disorders: Secondary | ICD-10-CM | POA: Insufficient documentation

## 2021-07-07 ENCOUNTER — Other Ambulatory Visit: Payer: Self-pay | Admitting: Family Medicine

## 2021-07-07 ENCOUNTER — Ambulatory Visit: Payer: Medicare (Managed Care) | Admitting: Podiatry

## 2021-07-07 DIAGNOSIS — Z1231 Encounter for screening mammogram for malignant neoplasm of breast: Secondary | ICD-10-CM

## 2021-08-30 ENCOUNTER — Other Ambulatory Visit: Payer: Self-pay

## 2021-08-30 ENCOUNTER — Encounter: Payer: Self-pay | Admitting: Emergency Medicine

## 2021-08-30 ENCOUNTER — Emergency Department
Admission: EM | Admit: 2021-08-30 | Discharge: 2021-08-30 | Disposition: A | Discharge status: Home / Self Care | Payer: Medicare Other | Attending: Emergency Medicine | Admitting: Emergency Medicine

## 2021-08-30 DIAGNOSIS — M79672 Pain in left foot: Secondary | ICD-10-CM | POA: Insufficient documentation

## 2021-08-30 MED ORDER — SALICYLIC ACID 17.6 % EX LIQD: 1.0000 [drp] | Freq: Every day | CUTANEOUS | 0 refills | Status: DC

## 2021-08-30 NOTE — ED Triage Notes (Signed)
No open wound noted to left food. Pt states pain is "on inside of foot"

## 2021-08-30 NOTE — ED Provider Notes (Signed)
[facility] Provider Note  Patient Contact: 5:06 PM (approximate)   History   Leg Pain and Wound Check   HPI  Leah Harris is a 66 y.o. female presents to the emergency department with pain along the dorsal aspect of the left foot.  Patient has a 3 cm x 2 cm region of hyper keratinized skin along the plantar aspect of her foot that has been really painful for the past 2 to 3 weeks.  Patient states that she has tried to wear better fitting shoes and has used shoe inserts with no relief.  She has been diagnosed with prediabetes.  She states that she has been trying to exfoliate skin at home.  She would like to see a foot specialist.      Physical Exam   Triage Vital Signs: ED Triage Vitals  Enc Vitals Group     BP 08/30/21 1507 118/69     Pulse Rate 08/30/21 1507 78     Resp 08/30/21 1507 18     Temp 08/30/21 1507 98.2 F (36.8 C)     Temp Source 08/30/21 1507 Oral     SpO2 08/30/21 1507 98 %     Weight 08/30/21 1455 164 lb 0.4 oz (74.4 kg)     Height 08/30/21 1455 4\' 11"  (1.499 m)     Head Circumference --      Peak Flow --      Pain Score 08/30/21 1455 10     Pain Loc --      Pain Edu? --      Excl. in [place]? --     Most recent vital signs: Vitals:   08/30/21 1507  BP: 118/69  Pulse: 78  Resp: 18  Temp: 98.2 F (36.8 C)  SpO2: 98%     General: Alert and in no acute distress. Eyes:  PERRL. EOMI. Head: No acute traumatic findings ENT:      Ears:       Nose: No congestion/rhinnorhea.      Mouth/Throat: Mucous membranes are moist. Neck: No stridor. No cervical spine tenderness to palpation. Cardiovascular:  Good peripheral perfusion Respiratory: Normal respiratory effort without tachypnea or retractions. Lungs CTAB. Good air entry to the bases with no decreased or absent breath sounds. Gastrointestinal: Bowel sounds 4 quadrants. Soft and nontender to palpation. No guarding or rigidity. No palpable masses. No distention. No CVA  tenderness. Musculoskeletal: Patient has a 3 cm x 2 cm region of hyper keratinized skin along plantar aspect of the left foot consistent with a corn.  She has no surrounding cellulitis or ulceration.  Palpable dorsalis pedis pulse, left. Neurologic:  No gross focal neurologic deficits are appreciated.  Skin:   No rash noted Other:   ED Results / Procedures / Treatments   Labs (all labs ordered are listed, but only abnormal results are displayed) Labs Reviewed - No data to display     [place] ED: Medications - No data to display   IMPRESSION / MDM / [place] / ED COURSE  I reviewed the triage vital signs and the nursing notes.                              Differential diagnosis includes, but is not limited to, corn, callus, bone spur...  Assessment and plan Foot pain 66 year old female presents to the emergency department with a 3 cm x 2 cm region of hyper  keratinized skin along the plantar aspect of the foot with no surrounding cellulitis or ulceration.  Patient states that she has had several bone spurs removed in the past and I suspect that patient might have a bone spur contributing to corn formation.  I recommended salicylic acid application and follow-up with podiatry as needed.      FINAL CLINICAL IMPRESSION(S) / ED DIAGNOSES   Final diagnoses:  Left foot pain     Rx / DC Orders   ED Discharge Orders          Ordered    Salicylic Acid 72.5 % LIQD  Daily        08/30/21 1703             Note:  This document was prepared using Dragon voice recognition software and may include unintentional dictation errors.   Vallarie Mare [place], PA-C 08/30/21 1709    Vladimir Crofts, MD 08/30/21 [phone]

## 2021-08-30 NOTE — Discharge Instructions (Addendum)
Apply one drop of Salicylic acid to corn and let dry. Repeat daily for 14 days. Please make follow up appointment with Dr. Cleda Mccreedy.

## 2021-08-30 NOTE — ED Triage Notes (Signed)
Pt comes into the ED via POV c/o wound on the bottom of right foot as well as bilateral leg pain.  Pt states the wound has been growing in size.  Pt is diabetic and is currently on Metformin.  Pt in NAD at this time and is ambulatory to triage at this time.

## 2021-09-08 ENCOUNTER — Ambulatory Visit (INDEPENDENT_AMBULATORY_CARE_PROVIDER_SITE_OTHER): Payer: Medicare Other

## 2021-09-08 ENCOUNTER — Ambulatory Visit: Payer: Medicare Other | Admitting: Podiatry

## 2021-09-08 ENCOUNTER — Other Ambulatory Visit: Payer: Self-pay

## 2021-09-08 ENCOUNTER — Encounter: Payer: Self-pay | Admitting: Podiatry

## 2021-09-08 DIAGNOSIS — M2012 Hallux valgus (acquired), left foot: Secondary | ICD-10-CM

## 2021-09-08 DIAGNOSIS — M2011 Hallux valgus (acquired), right foot: Secondary | ICD-10-CM

## 2021-09-08 NOTE — Progress Notes (Signed)
Subjective: 66 y.o. female presents today as a reestablish new patient for evaluation of left foot pain.  Patient does have a history of bunionectomy surgery to the bilateral feet.  Patient states that her right foot did very well after surgery however her left foot developed recurrence of the bunion.  Now she is developing hammertoes to the second and third digits and is very painful especially to the forefoot.  She has tried different shoe gear modifications and insoles with no improvement.  She works in [employer] on her feet.  Patient states that she is otherwise healthy.  She presents for further treatment evaluation   No past medical history on file.  Past Surgical History:  Procedure Laterality Date   AUGMENTATION MAMMAPLASTY Bilateral 2008   saline   BREAST SURGERY     BUNIONECTOMY     No Known Allergies  Objective: Physical Exam General: The patient is alert and oriented x3 in no acute distress.  Dermatology: Skin is cool, dry and supple bilateral lower extremities. Negative for open lesions or macerations.  Hyperkeratotic symptomatic calluses noted especially to the plantar aspect of the second and third MTP joint left foot  Vascular: Palpable pedal pulses bilaterally. No edema or erythema noted. Capillary refill within normal limits.  Neurological: Epicritic and protective threshold grossly intact bilaterally.   Musculoskeletal Exam: Clinical evidence of bunion deformity noted to the left foot. There is moderate pain on palpation range of motion of the first MPJ. Lateral deviation of the hallux noted consistent with hallux abductovalgus.  There is also hammertoe contracture of the second and third digits to the left foot.  There is associated tenderness and pain to palpation to the plantar aspect of the MTP second and third left with callus formation  The first ray of the right foot appears to be in a good alignment and there is no clinical evidence of a bunion  deformity  Radiographic Exam: Increased intermetatarsal angle greater than 15 with a hallux abductus angle greater than 30 noted on AP view. Moderate degenerative changes noted within the first MPJ.  There does appear to be previous surgery and alteration of the first metatarsal distally however there is no hardware noted. Elongated metatarsals 2, 3 of the left foot noted with MTP contracture.  Assessment: 1.  Recurrent HAV w/ bunion deformity left 2. H/o bunionectomy surgeries bilateral 3.  Hammertoes 2, 3 left 4.  Metatarsalgia left   Plan of Care:  1. Patient was evaluated. X-Rays reviewed. 2. Today we discussed the conservative versus surgical management of the presenting pathology. The patient opts for surgical management. All possible complications and details of the procedure were explained. All patient questions were answered. No guarantees were expressed or implied.  Patient states that she is otherwise healthy.  She denies a history of heart or lung pathology and states that her only past medical history is prediabetes. 3. Authorization for surgery was initiated today. Surgery will consist of bunionectomy with double osteotomy left.  PIPJ arthroplasty with MTP capsulotomy 2, 3 left.  Weil shortening osteotomies second and third metatarsals left. 4.  Return to clinic 1 week postop       Edrick Kins, DPM [facility]  Dr. Edrick Kins, DPM    [address]  [place], [place] [address]                Office [phone]  Fax [phone]

## 2021-09-11 ENCOUNTER — Telehealth: Payer: Self-pay | Admitting: Urology

## 2021-09-11 NOTE — Telephone Encounter (Signed)
DOS - 10/12/21  DOUBLE OSTEOTOMY LEFT --- 28299 METATARSAL OSTEOTOMY 2,3 LEFT --- 47583 CAPSULOTOMY 2,3 LEFT --- 07460 HAMMERTOE REPAIR 2,3 LEFT --- 02984   [facility] EFFECTIVE DATE - 08/27/21  PLAN DEDUCTIBLE - $0.00 OUT OF POCKET - $3,600.00 W/ $3,600.00 REMAINING COINSURANCE - 0% COPAY - $295.00   PER [employer] WEBSITE FOR CPT CODES 73085, 28308, 28270 AND 69437 Notification or Prior Authorization is not required for the requested services  Decision ID #:[id number]

## 2021-09-25 DIAGNOSIS — M170 Bilateral primary osteoarthritis of knee: Secondary | ICD-10-CM | POA: Diagnosis not present

## 2021-09-26 DIAGNOSIS — N8110 Cystocele, unspecified: Secondary | ICD-10-CM | POA: Diagnosis not present

## 2021-09-26 DIAGNOSIS — M25559 Pain in unspecified hip: Secondary | ICD-10-CM | POA: Diagnosis not present

## 2021-09-27 HISTORY — PX: BUNIONECTOMY: SHX129

## 2021-10-11 ENCOUNTER — Other Ambulatory Visit: Payer: Self-pay

## 2021-10-11 ENCOUNTER — Ambulatory Visit
Admission: RE | Admit: 2021-10-11 | Discharge: 2021-10-11 | Disposition: A | Discharge status: Home / Self Care | Payer: Medicare Other | Source: Ambulatory Visit | Attending: Family Medicine | Admitting: Family Medicine

## 2021-10-11 DIAGNOSIS — Z1231 Encounter for screening mammogram for malignant neoplasm of breast: Secondary | ICD-10-CM | POA: Insufficient documentation

## 2021-10-12 ENCOUNTER — Other Ambulatory Visit: Payer: Self-pay | Admitting: Podiatry

## 2021-10-12 ENCOUNTER — Encounter: Payer: Self-pay | Admitting: Podiatry

## 2021-10-12 DIAGNOSIS — M25572 Pain in left ankle and joints of left foot: Secondary | ICD-10-CM | POA: Diagnosis not present

## 2021-10-12 DIAGNOSIS — M21542 Acquired clubfoot, left foot: Secondary | ICD-10-CM | POA: Diagnosis not present

## 2021-10-12 DIAGNOSIS — M2012 Hallux valgus (acquired), left foot: Secondary | ICD-10-CM | POA: Diagnosis not present

## 2021-10-12 DIAGNOSIS — M2042 Other hammer toe(s) (acquired), left foot: Secondary | ICD-10-CM | POA: Diagnosis not present

## 2021-10-12 MED ORDER — OXYCODONE-ACETAMINOPHEN 5-325 MG PO TABS: 1.0000 | ORAL_TABLET | ORAL | 0 refills | Status: AC | PRN

## 2021-10-12 NOTE — Progress Notes (Signed)
PRN postop 

## 2021-10-17 ENCOUNTER — Other Ambulatory Visit: Payer: Self-pay | Admitting: Family Medicine

## 2021-10-17 DIAGNOSIS — R928 Other abnormal and inconclusive findings on diagnostic imaging of breast: Secondary | ICD-10-CM

## 2021-10-17 DIAGNOSIS — N6489 Other specified disorders of breast: Secondary | ICD-10-CM

## 2021-10-20 ENCOUNTER — Other Ambulatory Visit: Payer: Self-pay

## 2021-10-20 ENCOUNTER — Ambulatory Visit (INDEPENDENT_AMBULATORY_CARE_PROVIDER_SITE_OTHER): Payer: Medicare Other

## 2021-10-20 ENCOUNTER — Ambulatory Visit (INDEPENDENT_AMBULATORY_CARE_PROVIDER_SITE_OTHER): Payer: Medicare Other | Admitting: Podiatry

## 2021-10-20 VITALS — BP 126/63 | HR 64 | Temp 97.7°F

## 2021-10-20 DIAGNOSIS — M2012 Hallux valgus (acquired), left foot: Secondary | ICD-10-CM | POA: Diagnosis not present

## 2021-10-20 DIAGNOSIS — Z9889 Other specified postprocedural states: Secondary | ICD-10-CM

## 2021-10-20 NOTE — Progress Notes (Signed)
° °  Subjective:  Patient presents today status post bunionectomy with osteotomy as well as hammertoe repair 2, 3 with Weil shortening osteotomies left. DOS: 10/12/2021.  Patient states that she is doing well.  The pain is tolerable.  She has kept the dressings clean dry and intact weightbearing in the cam boot as instructed.  No new complaints at this time  No past medical history on file.    Objective/Physical Exam Neurovascular status intact.  Skin incisions appear to be well coapted with sutures and staples intact.  The percutaneous fixation pins appear to be intact with rectus alignment of the toes 1 through 3 no sign of infectious process noted. No dehiscence. No active bleeding noted. Moderate edema noted to the surgical extremity.  Radiographic Exam:  Orthopedic hardware and osteotomies sites appear to be stable with routine healing.  Percutaneous fixation pins noted 1-3 of the surgical foot.  Assessment: 1. s/p bunionectomy with osteotomy.  Hammertoe repair 2, 3 with Weil shortening osteotomy left. DOS: 10/12/2021   Plan of Care:  1. Patient was evaluated. X-rays reviewed 2.  Dressings changed.  Clean dry and intact x1 week 3.  Continue minimal weightbearing in the cam boot 4.  Return to clinic 1 week for suture and staple removal   Edrick Kins, DPM [facility]  Dr. Edrick Kins, DPM    [address], [place] [address]                Office [phone]  Fax [phone]

## 2021-10-27 ENCOUNTER — Encounter: Payer: Self-pay | Admitting: Podiatry

## 2021-10-27 ENCOUNTER — Ambulatory Visit (INDEPENDENT_AMBULATORY_CARE_PROVIDER_SITE_OTHER): Payer: Medicare Other | Admitting: Podiatry

## 2021-10-27 ENCOUNTER — Other Ambulatory Visit: Payer: Self-pay

## 2021-10-27 DIAGNOSIS — Z9889 Other specified postprocedural states: Secondary | ICD-10-CM

## 2021-10-27 NOTE — Progress Notes (Signed)
? ?  Subjective:  ?Patient presents today status post bunionectomy with osteotomy as well as hammertoe repair 2, 3 with Weil shortening osteotomies left. DOS: 10/12/2021.  Patient continues to do well.  She says that she has some intermittent pain.  She has been weightbearing in the cam boot.  No new complaints at this time ? ?No past medical history on file. ?  ? ?Objective/Physical Exam ?Neurovascular status intact.  Skin incisions appear to be well coapted with sutures and staples intact.  The percutaneous fixation pins appear to be intact with rectus alignment of the toes 1 through 3 no sign of infectious process noted. No dehiscence. No active bleeding noted. Moderate edema noted to the surgical extremity. ? ?Radiographic Exam 10/20/2021:  ?Orthopedic hardware and osteotomies sites appear to be stable with routine healing.  Percutaneous fixation pins noted 1-3 of the surgical foot. ? ?Assessment: ?1. s/p bunionectomy with osteotomy.  Hammertoe repair 2, 3 with Weil shortening osteotomy left. DOS: 10/12/2021 ? ? ?Plan of Care:  ?1. Patient was evaluated.  ?2.  Sutures and staples removed today ?3.  Dry sterile dressings applied.  Clean dry and intact x1 week ?4.  Continue minimal weightbearing in the cam boot ?5.  Return to clinic in 1 week for percutaneous pin removal and follow-up x-ray ? ?Edrick Kins, DPM ?[place] ? ?Dr. Edrick Kins, DPM  ?  ?[address]                                    ?[place], [place] [address]                ?Office [phone]  ?Fax [phone] ? ? ? ? ? ?

## 2021-10-30 ENCOUNTER — Ambulatory Visit
Admission: RE | Admit: 2021-10-30 | Discharge: 2021-10-30 | Disposition: A | Discharge status: Home / Self Care | Payer: Medicare Other | Source: Ambulatory Visit | Attending: Family Medicine | Admitting: Family Medicine

## 2021-10-30 ENCOUNTER — Other Ambulatory Visit: Payer: Self-pay

## 2021-10-30 DIAGNOSIS — N6489 Other specified disorders of breast: Secondary | ICD-10-CM | POA: Insufficient documentation

## 2021-10-30 DIAGNOSIS — R922 Inconclusive mammogram: Secondary | ICD-10-CM | POA: Diagnosis not present

## 2021-10-30 DIAGNOSIS — R928 Other abnormal and inconclusive findings on diagnostic imaging of breast: Secondary | ICD-10-CM | POA: Diagnosis not present

## 2021-11-03 ENCOUNTER — Other Ambulatory Visit: Payer: Self-pay

## 2021-11-03 ENCOUNTER — Ambulatory Visit (INDEPENDENT_AMBULATORY_CARE_PROVIDER_SITE_OTHER): Payer: Medicare Other | Admitting: Podiatry

## 2021-11-03 ENCOUNTER — Ambulatory Visit (INDEPENDENT_AMBULATORY_CARE_PROVIDER_SITE_OTHER): Payer: Medicare Other

## 2021-11-03 ENCOUNTER — Encounter: Payer: Self-pay | Admitting: Podiatry

## 2021-11-03 DIAGNOSIS — Z9889 Other specified postprocedural states: Secondary | ICD-10-CM

## 2021-11-03 DIAGNOSIS — M2012 Hallux valgus (acquired), left foot: Secondary | ICD-10-CM
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
## 2021-11-10 ENCOUNTER — Encounter: Payer: Medicare Other | Admitting: Podiatry

## 2021-11-10 DIAGNOSIS — N8110 Cystocele, unspecified: Secondary | ICD-10-CM | POA: Diagnosis not present

## 2021-11-10 DIAGNOSIS — E119 Type 2 diabetes mellitus without complications: Secondary | ICD-10-CM | POA: Diagnosis not present

## 2021-11-10 DIAGNOSIS — R81 Glycosuria: Secondary | ICD-10-CM | POA: Diagnosis not present

## 2021-11-10 DIAGNOSIS — Z01818 Encounter for other preprocedural examination: Secondary | ICD-10-CM | POA: Diagnosis not present

## 2021-11-17 ENCOUNTER — Ambulatory Visit (INDEPENDENT_AMBULATORY_CARE_PROVIDER_SITE_OTHER): Payer: Medicare Other

## 2021-11-17 ENCOUNTER — Encounter: Payer: Self-pay | Admitting: Podiatry

## 2021-11-17 ENCOUNTER — Ambulatory Visit (INDEPENDENT_AMBULATORY_CARE_PROVIDER_SITE_OTHER): Payer: Medicare Other | Admitting: Podiatry

## 2021-11-17 ENCOUNTER — Other Ambulatory Visit: Payer: Self-pay

## 2021-11-17 DIAGNOSIS — M2012 Hallux valgus (acquired), left foot: Secondary | ICD-10-CM

## 2021-11-17 DIAGNOSIS — Z9889 Other specified postprocedural states: Secondary | ICD-10-CM

## 2021-11-17 NOTE — Progress Notes (Signed)
? ?  Subjective:  ?Patient presents today status post bunionectomy with osteotomy as well as hammertoe repair 2, 3 with Weil shortening osteotomies left. DOS: 10/12/2021.  Patient continues to do well.  She says that she has not been wearing the cam boot around the house.  Because it is too heavy.  No pain.  No new complaints at this time ? ?No past medical history on file. ?  ? ?Objective/Physical Exam ?Neurovascular status intact.  Skin incisions healed.  There continues to be some moderate edema to the forefoot.  No erythema.  No significant tenderness to palpation throughout the forefoot ? ?Radiographic Exam 11/17/2021: ?Has been some movement of the capital fragment of the first metatarsal however clinically the foot appears to be in a good rectus alignment.  No significant change since last x-rays taken.  Overall decent alignment of the first ray.  Orthopedic hardware intact and does not appear to be backing out ? ?Assessment: ?1. s/p bunionectomy with osteotomy.  Hammertoe repair 2, 3 with Weil shortening osteotomy left. DOS: 10/12/2021 ? ? ?Plan of Care:  ?1. Patient was evaluated.  ?2.  Continue cam boot x4 weeks ?3.  Postsurgical shoe dispensed to wear just around the house into the bathroom from the bed ?4.  Compression ankle sleeve dispensed.  Wear daily ?5.  Return to clinic 4 weeks ? ?Edrick Kins, DPM ?[place] ? ?Dr. Edrick Kins, DPM  ?  ?[address]                                    ?[place], [place] [address]                ?Office [phone]  ?Fax [phone] ? ? ? ? ? ?

## 2021-12-12 DIAGNOSIS — Z1211 Encounter for screening for malignant neoplasm of colon: Secondary | ICD-10-CM | POA: Diagnosis not present

## 2021-12-12 DIAGNOSIS — E1140 Type 2 diabetes mellitus with diabetic neuropathy, unspecified: Secondary | ICD-10-CM | POA: Diagnosis not present

## 2021-12-12 DIAGNOSIS — Z0000 Encounter for general adult medical examination without abnormal findings: Secondary | ICD-10-CM | POA: Diagnosis not present

## 2021-12-12 DIAGNOSIS — B354 Tinea corporis: Secondary | ICD-10-CM | POA: Diagnosis not present

## 2021-12-12 DIAGNOSIS — E785 Hyperlipidemia, unspecified: Secondary | ICD-10-CM | POA: Diagnosis not present

## 2021-12-12 DIAGNOSIS — Z13220 Encounter for screening for lipoid disorders: Secondary | ICD-10-CM | POA: Diagnosis not present

## 2021-12-12 DIAGNOSIS — Z23 Encounter for immunization: Secondary | ICD-10-CM | POA: Diagnosis not present

## 2021-12-15 ENCOUNTER — Ambulatory Visit (INDEPENDENT_AMBULATORY_CARE_PROVIDER_SITE_OTHER): Payer: Medicare Other | Admitting: Podiatry

## 2021-12-15 ENCOUNTER — Ambulatory Visit (INDEPENDENT_AMBULATORY_CARE_PROVIDER_SITE_OTHER): Payer: Medicare Other

## 2021-12-15 DIAGNOSIS — Z9889 Other specified postprocedural states: Secondary | ICD-10-CM | POA: Diagnosis not present

## 2021-12-15 DIAGNOSIS — M2012 Hallux valgus (acquired), left foot: Secondary | ICD-10-CM

## 2021-12-15 NOTE — Progress Notes (Signed)
? ?  Subjective:  ?Patient presents today status post bunionectomy with osteotomy as well as hammertoe repair 2, 3 with Weil shortening osteotomies left. DOS: 10/12/2021.  Patient continues to do well.  She says that she cannot wear tennis shoes yet because of the swelling in her foot that increases throughout the day.  No new complaints at this time ? ?No past medical history on file. ?  ? ?Objective/Physical Exam ?Neurovascular status intact.  Skin incisions healed.  There continues to be some moderate edema to the forefoot.  No erythema.  No significant tenderness to palpation throughout the forefoot ? ?Radiographic Exam LT foot: ?Osteotomy stable.  Orthopedic hardware intact.  No significant change since prior x-rays.  Well-healing osteotomy sites. ? ?Assessment: ?1. s/p bunionectomy with osteotomy.  Hammertoe repair 2, 3 with Weil shortening osteotomy left. DOS: 10/12/2021 ? ? ?Plan of Care:  ?1. Patient was evaluated.  ?2.  Patient states that she cannot get into regular shoes because of the swelling intermittently. ?3.  Discontinue cam boot.  Postsurgical shoe dispensed until the swelling is resolved and she can return to normal tennis shoes ?4.  Additional compression ankle sleeves were dispensed.  Wear daily ?5.  Return to clinic in 6 weeks prior to leaving for [place] in June for follow-up x-ray ? ?Edrick Kins, DPM ?[place] ? ?Dr. Edrick Kins, DPM  ?  ?[address]                                    ?[place], [place] [address]                ?Office [phone]  ?Fax [phone] ? ? ? ? ? ?

## 2021-12-18 DIAGNOSIS — R413 Other amnesia: Secondary | ICD-10-CM | POA: Diagnosis not present

## 2021-12-18 DIAGNOSIS — Z0000 Encounter for general adult medical examination without abnormal findings: Secondary | ICD-10-CM | POA: Diagnosis not present

## 2021-12-18 DIAGNOSIS — G4700 Insomnia, unspecified: Secondary | ICD-10-CM | POA: Diagnosis not present

## 2021-12-18 DIAGNOSIS — Z1211 Encounter for screening for malignant neoplasm of colon: Secondary | ICD-10-CM | POA: Diagnosis not present

## 2021-12-19 DIAGNOSIS — G4700 Insomnia, unspecified: Secondary | ICD-10-CM | POA: Diagnosis not present

## 2021-12-19 DIAGNOSIS — E785 Hyperlipidemia, unspecified: Secondary | ICD-10-CM | POA: Diagnosis not present

## 2021-12-19 DIAGNOSIS — E1140 Type 2 diabetes mellitus with diabetic neuropathy, unspecified: Secondary | ICD-10-CM | POA: Diagnosis not present

## 2021-12-19 DIAGNOSIS — R413 Other amnesia: Secondary | ICD-10-CM | POA: Diagnosis not present

## 2021-12-20 DIAGNOSIS — R945 Abnormal results of liver function studies: Secondary | ICD-10-CM | POA: Diagnosis not present

## 2021-12-20 DIAGNOSIS — Z1389 Encounter for screening for other disorder: Secondary | ICD-10-CM | POA: Diagnosis not present

## 2021-12-21 DIAGNOSIS — M170 Bilateral primary osteoarthritis of knee: Secondary | ICD-10-CM | POA: Diagnosis not present

## 2021-12-22 ENCOUNTER — Other Ambulatory Visit: Payer: Self-pay | Admitting: Nurse Practitioner

## 2021-12-22 DIAGNOSIS — R7989 Other specified abnormal findings of blood chemistry: Secondary | ICD-10-CM

## 2021-12-28 ENCOUNTER — Ambulatory Visit
Admission: RE | Admit: 2021-12-28 | Discharge: 2021-12-28 | Disposition: A | Discharge status: Home / Self Care | Payer: Medicare Other | Source: Ambulatory Visit | Attending: Nurse Practitioner | Admitting: Nurse Practitioner

## 2021-12-28 DIAGNOSIS — R7989 Other specified abnormal findings of blood chemistry: Secondary | ICD-10-CM | POA: Insufficient documentation

## 2021-12-28 DIAGNOSIS — R945 Abnormal results of liver function studies: Secondary | ICD-10-CM | POA: Diagnosis not present

## 2022-01-09 DIAGNOSIS — E1140 Type 2 diabetes mellitus with diabetic neuropathy, unspecified: Secondary | ICD-10-CM | POA: Diagnosis not present

## 2022-01-19 DIAGNOSIS — H43813 Vitreous degeneration, bilateral: Secondary | ICD-10-CM | POA: Diagnosis not present

## 2022-01-25 DIAGNOSIS — Z712 Person consulting for explanation of examination or test findings: Secondary | ICD-10-CM | POA: Diagnosis not present

## 2022-01-25 DIAGNOSIS — G4700 Insomnia, unspecified: Secondary | ICD-10-CM | POA: Diagnosis not present

## 2022-01-25 DIAGNOSIS — Z1389 Encounter for screening for other disorder: Secondary | ICD-10-CM | POA: Diagnosis not present

## 2022-01-26 ENCOUNTER — Ambulatory Visit (INDEPENDENT_AMBULATORY_CARE_PROVIDER_SITE_OTHER): Payer: Medicare Other

## 2022-01-26 ENCOUNTER — Ambulatory Visit (INDEPENDENT_AMBULATORY_CARE_PROVIDER_SITE_OTHER): Payer: Medicare Other | Admitting: Podiatry

## 2022-01-26 DIAGNOSIS — M2012 Hallux valgus (acquired), left foot: Secondary | ICD-10-CM | POA: Diagnosis not present

## 2022-01-26 DIAGNOSIS — Z9889 Other specified postprocedural states: Secondary | ICD-10-CM

## 2022-02-06 NOTE — Progress Notes (Signed)
   Subjective:  Patient presents today status post bunionectomy with osteotomy as well as hammertoe repair 2, 3 with Weil shortening osteotomies left. DOS: 10/12/2021.  Overall the patient is doing very well.  She says that the swelling has decreased significantly since last visit.  She presents for further treatment and evaluation  No past medical history on file.    Objective/Physical Exam Neurovascular status intact.  Skin incisions healed.  Negative for any significant edema or tenderness with palpation throughout the forefoot. Radiographic Exam LT foot 01/26/2022: Osteotomy stable.  Orthopedic hardware intact.  No significant change since prior x-rays.  Well-healing osteotomy sites.  Assessment: 1. s/p bunionectomy with osteotomy.  Hammertoe repair 2, 3 with Weil shortening osteotomy left. DOS: 10/12/2021   Plan of Care:  1. Patient was evaluated.  2.  Overall the patient is doing well.  She may now wear good supportive shoes and sneakers 3.  Patient may also increase to full activity no restrictions 4.  Return to clinic as needed  Edrick Kins, DPM [facility]  Dr. Edrick Kins, DPM    [address], [place] [address]                Office [phone]  Fax [phone]

## 2022-05-03 DIAGNOSIS — N329 Bladder disorder, unspecified: Secondary | ICD-10-CM | POA: Diagnosis not present

## 2022-05-03 DIAGNOSIS — E1140 Type 2 diabetes mellitus with diabetic neuropathy, unspecified: Secondary | ICD-10-CM | POA: Diagnosis not present

## 2022-05-03 DIAGNOSIS — Z01810 Encounter for preprocedural cardiovascular examination: Secondary | ICD-10-CM | POA: Diagnosis not present

## 2022-05-03 DIAGNOSIS — Z1389 Encounter for screening for other disorder: Secondary | ICD-10-CM | POA: Diagnosis not present

## 2022-05-03 DIAGNOSIS — Z712 Person consulting for explanation of examination or test findings: Secondary | ICD-10-CM | POA: Diagnosis not present

## 2022-05-14 DIAGNOSIS — L309 Dermatitis, unspecified: Secondary | ICD-10-CM | POA: Diagnosis not present

## 2022-06-13 DIAGNOSIS — M170 Bilateral primary osteoarthritis of knee: Secondary | ICD-10-CM | POA: Diagnosis not present

## 2022-08-14 DIAGNOSIS — M9902 Segmental and somatic dysfunction of thoracic region: Secondary | ICD-10-CM | POA: Diagnosis not present

## 2022-08-14 DIAGNOSIS — M9901 Segmental and somatic dysfunction of cervical region: Secondary | ICD-10-CM | POA: Diagnosis not present

## 2022-08-14 DIAGNOSIS — M542 Cervicalgia: Secondary | ICD-10-CM | POA: Diagnosis not present

## 2022-08-14 DIAGNOSIS — M62830 Muscle spasm of back: Secondary | ICD-10-CM | POA: Diagnosis not present

## 2022-08-15 DIAGNOSIS — M545 Low back pain, unspecified: Secondary | ICD-10-CM | POA: Diagnosis not present

## 2022-08-16 DIAGNOSIS — M9901 Segmental and somatic dysfunction of cervical region: Secondary | ICD-10-CM | POA: Diagnosis not present

## 2022-08-16 DIAGNOSIS — M9902 Segmental and somatic dysfunction of thoracic region: Secondary | ICD-10-CM | POA: Diagnosis not present

## 2022-08-16 DIAGNOSIS — M542 Cervicalgia: Secondary | ICD-10-CM | POA: Diagnosis not present

## 2022-08-16 DIAGNOSIS — M62830 Muscle spasm of back: Secondary | ICD-10-CM | POA: Diagnosis not present

## 2022-08-21 DIAGNOSIS — M170 Bilateral primary osteoarthritis of knee: Secondary | ICD-10-CM | POA: Diagnosis not present

## 2022-08-22 DIAGNOSIS — M9902 Segmental and somatic dysfunction of thoracic region: Secondary | ICD-10-CM | POA: Diagnosis not present

## 2022-08-22 DIAGNOSIS — M9901 Segmental and somatic dysfunction of cervical region: Secondary | ICD-10-CM | POA: Diagnosis not present

## 2022-08-22 DIAGNOSIS — M62830 Muscle spasm of back: Secondary | ICD-10-CM | POA: Diagnosis not present

## 2022-08-22 DIAGNOSIS — M542 Cervicalgia: Secondary | ICD-10-CM | POA: Diagnosis not present

## 2022-09-10 DIAGNOSIS — M170 Bilateral primary osteoarthritis of knee: Secondary | ICD-10-CM | POA: Diagnosis not present

## 2022-09-11 DIAGNOSIS — M545 Low back pain, unspecified: Secondary | ICD-10-CM | POA: Diagnosis not present

## 2022-09-11 DIAGNOSIS — M170 Bilateral primary osteoarthritis of knee: Secondary | ICD-10-CM | POA: Diagnosis not present

## 2022-09-14 DIAGNOSIS — Z712 Person consulting for explanation of examination or test findings: Secondary | ICD-10-CM | POA: Diagnosis not present

## 2022-09-14 DIAGNOSIS — Z0000 Encounter for general adult medical examination without abnormal findings: Secondary | ICD-10-CM | POA: Diagnosis not present

## 2022-09-14 DIAGNOSIS — E1140 Type 2 diabetes mellitus with diabetic neuropathy, unspecified: Secondary | ICD-10-CM | POA: Diagnosis not present

## 2022-09-14 DIAGNOSIS — Z23 Encounter for immunization: Secondary | ICD-10-CM | POA: Diagnosis not present

## 2022-09-14 DIAGNOSIS — Z1389 Encounter for screening for other disorder: Secondary | ICD-10-CM | POA: Diagnosis not present

## 2022-09-17 DIAGNOSIS — M5459 Other low back pain: Secondary | ICD-10-CM | POA: Diagnosis not present

## 2022-09-17 DIAGNOSIS — M170 Bilateral primary osteoarthritis of knee: Secondary | ICD-10-CM | POA: Diagnosis not present

## 2022-09-18 DIAGNOSIS — N8110 Cystocele, unspecified: Secondary | ICD-10-CM | POA: Diagnosis not present

## 2022-09-18 DIAGNOSIS — N816 Rectocele: Secondary | ICD-10-CM | POA: Diagnosis not present

## 2022-09-18 DIAGNOSIS — E119 Type 2 diabetes mellitus without complications: Secondary | ICD-10-CM | POA: Diagnosis not present

## 2022-09-27 ENCOUNTER — Other Ambulatory Visit: Payer: Self-pay | Admitting: Family Medicine

## 2022-09-27 DIAGNOSIS — Z1231 Encounter for screening mammogram for malignant neoplasm of breast: Secondary | ICD-10-CM

## 2022-10-04 DIAGNOSIS — M170 Bilateral primary osteoarthritis of knee: Secondary | ICD-10-CM | POA: Diagnosis not present

## 2022-10-04 DIAGNOSIS — M5459 Other low back pain: Secondary | ICD-10-CM | POA: Diagnosis not present

## 2022-10-09 DIAGNOSIS — D179 Benign lipomatous neoplasm, unspecified: Secondary | ICD-10-CM | POA: Diagnosis not present

## 2022-10-10 DIAGNOSIS — M170 Bilateral primary osteoarthritis of knee: Secondary | ICD-10-CM | POA: Diagnosis not present

## 2022-10-10 DIAGNOSIS — M5416 Radiculopathy, lumbar region: Secondary | ICD-10-CM | POA: Diagnosis not present

## 2022-10-10 DIAGNOSIS — M5459 Other low back pain: Secondary | ICD-10-CM | POA: Diagnosis not present

## 2022-10-12 DIAGNOSIS — M170 Bilateral primary osteoarthritis of knee: Secondary | ICD-10-CM | POA: Diagnosis not present

## 2022-10-12 DIAGNOSIS — M5459 Other low back pain: Secondary | ICD-10-CM | POA: Diagnosis not present

## 2022-10-16 ENCOUNTER — Encounter: Payer: Self-pay | Admitting: Podiatry

## 2022-10-16 ENCOUNTER — Ambulatory Visit: Payer: Medicare Other | Admitting: Podiatry

## 2022-10-16 ENCOUNTER — Ambulatory Visit (INDEPENDENT_AMBULATORY_CARE_PROVIDER_SITE_OTHER): Payer: Medicare Other

## 2022-10-16 VITALS — BP 108/71 | HR 67

## 2022-10-16 DIAGNOSIS — M7751 Other enthesopathy of right foot: Secondary | ICD-10-CM | POA: Diagnosis not present

## 2022-10-16 DIAGNOSIS — M779 Enthesopathy, unspecified: Secondary | ICD-10-CM | POA: Diagnosis not present

## 2022-10-16 DIAGNOSIS — R52 Pain, unspecified: Secondary | ICD-10-CM | POA: Diagnosis not present

## 2022-10-16 MED ORDER — BETAMETHASONE SOD PHOS & ACET 6 (3-3) MG/ML IJ SUSP
3.0000 mg | Freq: Once | INTRAMUSCULAR | Status: AC
  Administered 2022-10-16: 3 mg via INTRA_ARTICULAR

## 2022-10-16 MED ORDER — MELOXICAM 15 MG PO TABS: 15.0000 mg | ORAL_TABLET | Freq: Every day | ORAL | 1 refills | Status: DC
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
## 2022-10-17 ENCOUNTER — Ambulatory Visit
Admission: RE | Admit: 2022-10-17 | Discharge: 2022-10-17 | Disposition: A | Discharge status: Home / Self Care | Payer: Medicare Other | Source: Ambulatory Visit | Attending: Family Medicine | Admitting: Family Medicine

## 2022-10-17 DIAGNOSIS — M5416 Radiculopathy, lumbar region: Secondary | ICD-10-CM | POA: Diagnosis not present

## 2022-10-17 DIAGNOSIS — Z1231 Encounter for screening mammogram for malignant neoplasm of breast: Secondary | ICD-10-CM | POA: Diagnosis not present

## 2022-10-19 DIAGNOSIS — M170 Bilateral primary osteoarthritis of knee: Secondary | ICD-10-CM | POA: Diagnosis not present

## 2022-10-19 DIAGNOSIS — M5459 Other low back pain: Secondary | ICD-10-CM | POA: Diagnosis not present

## 2022-10-22 ENCOUNTER — Ambulatory Visit: Payer: Medicare Other | Admitting: Surgery

## 2022-10-22 ENCOUNTER — Encounter: Payer: Self-pay | Admitting: Surgery

## 2022-10-22 VITALS — BP 143/72 | HR 69 | Temp 98.9°F | Wt 134.4 lb

## 2022-10-22 DIAGNOSIS — D171 Benign lipomatous neoplasm of skin and subcutaneous tissue of trunk: Secondary | ICD-10-CM | POA: Diagnosis not present

## 2022-10-22 NOTE — Progress Notes (Signed)
10/22/2022  Reason for Visit:  Lipoma of back  Requesting Provider:  Rutherford Guys, MD  History of Present Illness: Leah Harris is a 67 y.o. female presenting for evaluation of a lipoma of the back.  The patient reports that she's noticed this for about 2 years.  It is located in the right mid back.  She mentions that it has grown in size and is causing symptoms due to mass effect, particularly when she twists her torso to look towards the right or when she lies down and leans on the back/mass area.  She feels it used to be softer as well.  Denies any decrease in range of motion and denies any redness of the skin or drainage of fluid.    The patient reports that she's scheduled for surgery for vaginal prolapse in April and after that she's supposed to have surgery for her bilateral knee osteoarthritis.  Past Medical History: Past Medical History:  Diagnosis Date   Diabetes ([place])    Hyperlipidemia    Osteoarthritis    Vaginal prolapse      Past Surgical History: Past Surgical History:  Procedure Laterality Date   AUGMENTATION MAMMAPLASTY Bilateral 2008   saline   AUGMENTATION MAMMAPLASTY Bilateral 11/2020   had implants replaced   BREAST SURGERY     BUNIONECTOMY      Home Medications: Prior to Admission medications   Medication Sig Start Date End Date Taking? Authorizing Provider  acetaminophen (TYLENOL) 500 MG tablet Take by mouth.   Yes [provider]  gabapentin (NEURONTIN) 300 MG capsule Take by mouth. 06/17/14  Yes [provider]  meloxicam (MOBIC) 15 MG tablet Take 1 tablet (15 mg total) by mouth daily. 10/16/22  Yes Edrick Kins, DPM  metFORMIN (GLUCOPHAGE) 500 MG tablet Take 500 mg by mouth 2 (two) times daily. 08/17/21  Yes [provider]  naproxen (NAPROSYN) 500 MG tablet Take by mouth. 12/07/14  Yes [provider]  oxyCODONE-acetaminophen (PERCOCET) 5-325 MG tablet Take 1 tablet by mouth every 4 (four) hours as needed  for severe pain. 10/12/21  Yes Edrick Kins, DPM  Salicylic Acid XX123456 % LIQD Apply 1 drop topically daily. 08/30/21  Yes Vallarie Mare M, PA-C  SODIUM FLUORIDE 5000 ENAMEL 1.1-5 % GEL Take by mouth. 07/17/21  Yes [provider]    Allergies: No Known Allergies  Social History:  reports that she has never smoked. She has never used smokeless tobacco. She reports that she does not drink alcohol and does not use drugs.   Family History: Family History  Problem Relation Age of Onset   Hypertension Mother    Asthma Mother        Deceased   Heart disease Father        Deceased   Healthy Child         x 5   Thyroid disease Daughter    Breast cancer Neg Hx     Review of Systems: Review of Systems  Constitutional:  Negative for chills and fever.  Respiratory:  Negative for shortness of breath.   Cardiovascular:  Negative for chest pain.  Gastrointestinal:  Negative for abdominal pain, nausea and vomiting.  Skin:        Mass in right mid back    Physical Exam BP (!) 143/72   Pulse 69   Temp 98.9 F (37.2 C) (Oral)   Wt 134 lb 6.4 oz (61 kg)   LMP  (LMP Unknown)   SpO2 98%  BMI 27.15 kg/m  CONSTITUTIONAL: No acute distress, well nourished. HEENT:  Normocephalic, atraumatic, extraocular motion intact. RESPIRATORY:  Normal respiratory effort without pathologic use of accessory muscles. CARDIOVASCULAR: Regular rhythm and rate. MUSCULOSKELETAL:  Normal muscle strength and tone in all four extremities.  No peripheral edema or cyanosis. SKIN: The patient has a 3.5 x 4.5 cm mass in the right mid back.  It is soft, mobile, non-tender to palpation.  There is no overlying erythema or skin changes.  NEUROLOGIC:  Motor and sensation is grossly normal.  Cranial nerves are grossly intact. PSYCH:  Alert and oriented to person, place and time. Affect is normal.  Laboratory Analysis: Labs from 09/14/22: Na 140, K 4.3, Cl 100, CO2 24, BUN 21, Cr 0.45.  Total bili 0.5, AST 24, ALT  26, Alk Phos 65.  HgA1c 6.4%  Imaging: No results found.  Assessment and Plan: This is a 67 y.o. female with a right mid back lipoma  --Discussed with the patient the findings on exam that are consistent with a lipoma.  Discussed with her that this is a benign mass of fatty tissue, but it can grown in size with time.  Unfortunately it does not go away on its own.  Given that she's having symptoms with it, would recommend excising it.  She's in agreement.  Discussed with her the options for excision in the office under local anesthesia or in the OR under general anesthesia.  She would prefer in office.  Reviewed with her the procedure at length including the planned incision, the risks of bleeding, infection, injury to surrounding structures, post-op pain control, healing time, and she's willing to proceed. --Will schedule her for office excision of her lipoma on 10/31/22.  This would leave her plenty of time to complete the full healing process prior to her first surgery.  All of her questions have been answered.  I spent 30 minutes dedicated to the care of this patient on the date of this encounter to include pre-visit review of records, face-to-face time with the patient discussing diagnosis and management, and any post-visit coordination of care.   Melvyn Neth, [facility]

## 2022-10-22 NOTE — Patient Instructions (Addendum)
Si tiene alguna inquietud o pregunta, no dude en llamar a nuestra oficina. Vea la cita de seguimiento a continuacin.  Extraccin de lipoma Lipoma Removal   La extraccin de un lipoma es un procedimiento quirrgico que se realiza para extirpar un lipoma, que es un tumor no canceroso (benigno) que est formado por clulas adiposas. La mayora de los lipomas son pequeos e indoloros, y no requieren Clinical research associate. Se pueden formar en muchas zonas del cuerpo, pero la zona ms frecuente es debajo de la piel de la espalda, los Roy, los hombros, las nalgas y los muslos. Es posible que deba realizarse una extraccin si tiene un lipoma grande, que le est creciendo o que le causa molestias. Tambin se puede extraer un lipoma con fines estticos. Informe al mdico acerca de lo siguiente: Cualquier alergia que tenga. Todos los [employer] [place], incluidos vitaminas, hierbas, gotas oftlmicas, cremas y medicamentos de venta libre. Problemas previos que usted o algn miembro de su familia hayan tenido con los anestsicos. Cualquier problema de la sangre que tenga. Cirugas a las que se haya sometido. Cualquier afeccin mdica que tenga. Si est embarazada o podra estarlo. Cules son los riesgos? En general, se trata de un procedimiento seguro. Sin embargo, pueden ocurrir complicaciones, por ejemplo: Infeccin. Sangrado. Formacin de cicatrices. Reacciones alrgicas a los medicamentos. Dao a estructuras u rganos cercanos, como los nervios o vasos sanguneos cercanos al lipoma. Qu ocurre antes del procedimiento? Cundo dejar de comer y beber Siga las instrucciones del mdico con respecto a lo que puede comer y beber antes del procedimiento. Pueden incluir: Ocho horas antes del procedimiento Deje de comer la mayora de los alimentos. No coma carne, alimentos fritos ni alimentos grasos. Consuma solo alimentos livianos, como tostadas o Soil scientist. Todos los lquidos son aceptables, excepto  las bebidas energticas y el alcohol. Seis horas antes del procedimiento Deje de comer. Beba nicamente lquidos transparentes, como agua, jugo de fruta transparente, caf solo, t solo y bebidas deportivas. No consuma bebidas energticas ni alcohol. Dos horas antes del procedimiento Deje de beber todos los lquidos. Es posible que le permitan tomar medicamentos con pequeos sorbos de [place]. Si no sigue las instrucciones del mdico, el procedimiento puede retrasarse o cancelarse. Medicamentos Consulte al mdico si debe hacer o no lo siguiente: Quarry manager o suspender los medicamentos que [place] habitualmente. Esto es muy importante si toma medicamentos para la diabetes o anticoagulantes. Tomar medicamentos como aspirina e ibuprofeno. Estos medicamentos pueden tener un efecto anticoagulante en la [place]. No tome estos medicamentos a menos que el mdico se lo indique. Usar medicamentos de venta libre, vitaminas, hierbas y suplementos. Indicaciones generales Le harn un examen fsico. El mdico verificar el tamao del lipoma y si se puede extirpar con facilidad. Pueden hacerle una biopsia y estudios de diagnstico por imgenes, como radiografas, una exploracin por tomografa computarizada (TC) o una resonancia magntica (RM). No consuma ningn producto que contenga nicotina ni tabaco durante al [employer] las 4 semanas anteriores al procedimiento. Estos productos incluyen cigarrillos, tabaco para Higher education careers adviser y aparatos de vapeo, como los Psychologist, sport and exercise. Si necesita ayuda para dejar de consumir estos productos, consulte al [employer]. Pregntele al mdico: Cmo se Tax inspector de la Leisure centre manager. Qu medidas se tomarn para evitar una infeccin. Pueden incluir: Lavar la piel con un jabn antisptico. Recibir antibiticos. Si va a marcharse a su casa inmediatamente despus del procedimiento, pdale a un adulto responsable que: Lo lleve a su casa desde el hospital o la clnica. No se le permitir  conducir. Lo cuide durante el [employer] indiquen. Qu ocurre durante el procedimiento?  Le colocarn una va intravenosa (i.v.) en una vena. Le administrarn uno o ms de los siguientes medicamentos: Un medicamento para ayudar a Nurse, children's (sedante). Un medicamento para adormecer la zona (anestesia local). Un medicamento que lo har dormir (anestesia general). Un medicamento que se inyecta en una zona del cuerpo para adormecer toda la regin que se encuentra por debajo del sitio de la inyeccin (anestesia regional). Se realizar una incisin en la piel sobre el lipoma o muy cerca de l. La incisin puede hacerse en una lnea o un pliegue natural de la piel. Los tejidos, los nervios y los vasos sanguneos ubicados cerca del lipoma se movern para no tocarlos. El lipoma y la cpsula que lo rodea se separarn de los tejidos circundantes. Se extirpar el lipoma. La incisin puede cerrarse con puntos (suturas). Se colocar una venda (vendaje) sobre la incisin. El procedimiento puede variar segn el mdico y el hospital. Sander Nephew ocurre despus del procedimiento? Le controlarn la presin arterial, la frecuencia cardaca, la frecuencia respiratoria y Retail buyer de oxgeno en la sangre hasta que le den el alta del hospital o la clnica. Si le recetaron un antibitico, tmelo como se lo haya indicado el mdico. No deje de usar el antibitico aunque comience a Sports administrator. Si le administraron un sedante durante el procedimiento, puede afectarlo por varias horas. No conduzca ni opere maquinaria hasta que el mdico le indique que es seguro [facility]. Dnde obtener ms informacin OrthoInfo: orthoinfo.aaos.org Resumen Antes del procedimiento, siga las indicaciones del mdico sobre qu puede comer y Electronics engineer, y si tiene que cambiar o suspender los medicamentos que toma habitualmente. Esto es muy importante si toma medicamentos para la diabetes o anticoagulantes. Despus de que le extirpen el lipoma,  posiblemente le cierren la incisin con puntos (suturas) y se la cubran con una venda (vendaje). Si le administraron un sedante durante el procedimiento, puede afectarlo por varias horas. No conduzca ni opere maquinaria hasta que el mdico le indique que es seguro IXL. Esta informacin no tiene Marine scientist el consejo del mdico. Asegrese de hacerle al mdico cualquier pregunta que tenga. Document Revised: 09/27/2021 Document Reviewed: 09/27/2021 Elsevier Patient Education  [place].

## 2022-10-25 DIAGNOSIS — M5459 Other low back pain: Secondary | ICD-10-CM | POA: Diagnosis not present

## 2022-10-25 DIAGNOSIS — M170 Bilateral primary osteoarthritis of knee: Secondary | ICD-10-CM | POA: Diagnosis not present

## 2022-10-26 DIAGNOSIS — M5416 Radiculopathy, lumbar region: Secondary | ICD-10-CM | POA: Diagnosis not present

## 2022-10-31 ENCOUNTER — Ambulatory Visit: Payer: Medicare Other | Admitting: Surgery

## 2022-10-31 ENCOUNTER — Encounter: Payer: Self-pay | Admitting: Surgery

## 2022-10-31 VITALS — BP 126/77 | HR 66 | Temp 98.0°F | Ht 60.0 in | Wt 129.0 lb

## 2022-10-31 DIAGNOSIS — D171 Benign lipomatous neoplasm of skin and subcutaneous tissue of trunk: Secondary | ICD-10-CM | POA: Diagnosis not present

## 2022-10-31 NOTE — Progress Notes (Signed)
  Procedure Date:  10/31/2022  Pre-operative Diagnosis:  Right upper back lipoma  Post-operative Diagnosis:  Right upper back lipoma, 4.5 cm  Procedure:  Excision of right upper back lipoma  Surgeon:  Melvyn Neth, MD  Anesthesia:  8 ml of 1% lidocaine with epi  Estimated Blood Loss:  10 ml  Specimens:  Right upper back lipoma  Complications:  None  Indications for Procedure:  This is a 67 y.o. female with diagnosis of a symptomatic right upper back lipoma.  The patient wishes to have this excised. The risks of bleeding, abscess or infection, injury to surrounding structures, and need for further procedures were all discussed with the patient and she was willing to proceed.  Description of Procedure: The patient was correctly identified at bedside.  The patient was placed supine.  Appropriate time-outs were performed.  The patient's right upper back was prepped and draped in usual sterile fashion.  Local anesthetic was infused intradermally.  A 4 cm incision was made over the lipoma, and scalpel was used to dissect down the skin and subcutaneous tissue.  Skin flaps were created sharply, and then the lipoma was excised intact.  It was sent off to pathology.  The cavity was then irrigated and hemostasis was assured with manual pressure.  The wound was then closed in three layers using 3-0 Vicryl and 4-0 Monocryl, tacking the dermis to the underlying fascia to help with any seroma formation.  The incision was cleaned and sealed with DermaBond.  The patient tolerated the procedure well and all sharps were appropriately disposed of at the end of the case.  --Patient may shower tomorrow --May take tylenol or ibuprofen for pain control --Activity restrictions discussed with the patient --Follow up next week for wound check  Melvyn Neth, MD

## 2022-10-31 NOTE — Patient Instructions (Signed)
Today we have removed a Lipoma in our office. Please see information below regarding this type of tumor.  You are free to shower tomorrow. You have glue on your skin and sutures under the skin. The glue will come off on it's own in 10-14 days. You may shower normally until this occurs but do not submerge.  Please use Tylenol or Ibuprofen for pain as needed. You may use ice to the area 3-4 times today and tomorrow for any achiness.   We will see you back in 2 weeks to ensure that this has healed and to review the final pathology. Please see your appointment below. You may continue your regular activities right away but if you are having pain while doing something, stop what you are doing and try this activity once again in 3 days. Please call our office with any questions or concerns prior to your appointment.   Lipoma Removal Lipoma removal is a surgical procedure to remove a noncancerous (benign) tumor that is made up of fat cells (lipoma). Most lipomas are small and painless and do not require treatment. They can form in many areas of the body but are most common under the skin of the back, shoulders, arms, and thighs. You may need lipoma removal if you have a lipoma that is large, growing, or causing discomfort. Lipoma removal may also be done for cosmetic reasons. Tell a health care provider about: Any allergies you have. All medicines you are taking, including vitamins, herbs, eye drops, creams, and over-the-counter medicines. Any problems you or family members have had with anesthetic medicines. Any blood disorders you have. Any surgeries you have had. Any medical conditions you have. Whether you are pregnant or may be pregnant. What are the risks? Generally, this is a safe procedure. However, problems may occur, including: Infection. Bleeding. Allergic reactions to medicines. Damage to nerves or blood vessels near the lipoma. Scarring.  Medicines Ask your health care provider  about: Changing or stopping your regular medicines. This is especially important if you are taking diabetes medicines or blood thinners. Taking medicines such as aspirin and ibuprofen. These medicines can thin your blood. Do not take these medicines before your procedure if your health care provider instructs you not to. You may be given antibiotic medicine to help prevent infection. General instructions Ask your health care provider how your surgical site will be marked or identified. You will have a physical exam. Your health care provider will check the size of the lipoma and whether it can be moved easily.  What happens during the procedure? To reduce your risk of infection: Your health care team will wash or sanitize their hands. Your skin will be washed with soap. You will be given the following: A medicine to numb the area (local anesthetic). An incision will be made over the lipoma or very near the lipoma. The incision may be made in a natural skin line or crease. Tissues, nerves, and blood vessels near the lipoma will be moved out of the way. The lipoma and the capsule that surrounds it will be separated from the surrounding tissues. The lipoma will be removed. The incision may be closed with stitches (sutures). A bandage (dressing) will be placed over the incision.

## 2022-11-07 DIAGNOSIS — M545 Low back pain, unspecified: Secondary | ICD-10-CM | POA: Diagnosis not present

## 2022-11-08 ENCOUNTER — Ambulatory Visit (INDEPENDENT_AMBULATORY_CARE_PROVIDER_SITE_OTHER): Payer: Medicare Other | Admitting: Physician Assistant

## 2022-11-08 ENCOUNTER — Encounter: Payer: Self-pay | Admitting: Physician Assistant

## 2022-11-08 VITALS — BP 121/80 | HR 81 | Temp 98.0°F | Ht 58.66 in | Wt 134.0 lb

## 2022-11-08 DIAGNOSIS — D171 Benign lipomatous neoplasm of skin and subcutaneous tissue of trunk: Secondary | ICD-10-CM

## 2022-11-08 DIAGNOSIS — Z09 Encounter for follow-up examination after completed treatment for conditions other than malignant neoplasm: Secondary | ICD-10-CM

## 2022-11-08 NOTE — Progress Notes (Signed)
[facility] POST-OP OFFICE VISIT  11/08/2022  HPI: Leah Harris is a 67 y.o. female 8 days s/p excision of right lateral upper back lipoma (14.5 cm) with Dr Hampton Abbot   She is doing well Very minimal discomfort No fever, chills No issues with incision No other complaints   Vital signs: BP 121/80   Pulse 81   Temp 98 F (36.7 C)   Ht 4' 10.66" (1.49 m)   Wt 134 lb (60.8 kg)   LMP  (LMP Unknown)   SpO2 98%   BMI 27.38 kg/m    Physical Exam: Constitutional: Well appearing female, NAD Skin: Approximately 4 cm incision to the right lateral mid-back, this is CDI, dermabond is flaking off, no erythema, no drainage, no underlying swelling   Assessment/Plan: This is a 67 y.o. female 8 days s/p excision of right lateral upper back lipoma (14.5 cm) with Dr Hampton Abbot    - Pain control prn  - Reviewed wound care recommendation  - Reviewed surgical pathology; Lipoma  - She can follow up on as needed basis; She understands to call with questions/concerns  -- Edison Simon, PA-C [facility] 11/08/2022, 3:43 PM M-F: 7am - 4pm

## 2022-11-08 NOTE — Patient Instructions (Signed)
   Follow-up with our office as needed.  Please call and ask to speak with a nurse if you develop questions or concerns.  

## 2022-11-09 DIAGNOSIS — M170 Bilateral primary osteoarthritis of knee: Secondary | ICD-10-CM | POA: Diagnosis not present

## 2022-11-16 DIAGNOSIS — M170 Bilateral primary osteoarthritis of knee: Secondary | ICD-10-CM | POA: Diagnosis not present

## 2022-11-26 DIAGNOSIS — M170 Bilateral primary osteoarthritis of knee: Secondary | ICD-10-CM | POA: Diagnosis not present

## 2022-12-07 DIAGNOSIS — Z01818 Encounter for other preprocedural examination: Secondary | ICD-10-CM | POA: Diagnosis not present

## 2022-12-07 DIAGNOSIS — N8111 Cystocele, midline: Secondary | ICD-10-CM | POA: Diagnosis not present

## 2022-12-07 DIAGNOSIS — N816 Rectocele: Secondary | ICD-10-CM | POA: Diagnosis not present

## 2022-12-07 DIAGNOSIS — N8110 Cystocele, unspecified: Secondary | ICD-10-CM | POA: Diagnosis not present

## 2022-12-17 DIAGNOSIS — E785 Hyperlipidemia, unspecified: Secondary | ICD-10-CM | POA: Diagnosis not present

## 2022-12-17 DIAGNOSIS — E119 Type 2 diabetes mellitus without complications: Secondary | ICD-10-CM | POA: Diagnosis not present

## 2022-12-17 DIAGNOSIS — Z79899 Other long term (current) drug therapy: Secondary | ICD-10-CM | POA: Diagnosis not present

## 2022-12-17 DIAGNOSIS — K4090 Unilateral inguinal hernia, without obstruction or gangrene, not specified as recurrent: Secondary | ICD-10-CM | POA: Diagnosis not present

## 2022-12-17 DIAGNOSIS — Z791 Long term (current) use of non-steroidal anti-inflammatories (NSAID): Secondary | ICD-10-CM | POA: Diagnosis not present

## 2022-12-17 DIAGNOSIS — N994 Postprocedural pelvic peritoneal adhesions: Secondary | ICD-10-CM | POA: Diagnosis not present

## 2022-12-17 HISTORY — PX: ROBOTIC ASSISTED LAPAROSCOPIC LYSIS OF ADHESION: SHX6080

## 2022-12-17 HISTORY — PX: COLPORRHAPHY: SHX921

## 2022-12-17 HISTORY — PX: CYSTOURETHROSCOPY: SHX476

## 2022-12-26 DIAGNOSIS — M545 Low back pain, unspecified: Secondary | ICD-10-CM | POA: Diagnosis not present

## 2022-12-28 DIAGNOSIS — M170 Bilateral primary osteoarthritis of knee: Secondary | ICD-10-CM | POA: Diagnosis not present

## 2023-01-02 DIAGNOSIS — E119 Type 2 diabetes mellitus without complications: Secondary | ICD-10-CM | POA: Diagnosis not present

## 2023-01-24 DIAGNOSIS — H43813 Vitreous degeneration, bilateral: Secondary | ICD-10-CM | POA: Diagnosis not present

## 2023-01-24 DIAGNOSIS — M3501 Sicca syndrome with keratoconjunctivitis: Secondary | ICD-10-CM | POA: Diagnosis not present

## 2023-01-24 DIAGNOSIS — H25013 Cortical age-related cataract, bilateral: Secondary | ICD-10-CM | POA: Diagnosis not present

## 2023-01-29 DIAGNOSIS — K439 Ventral hernia without obstruction or gangrene: Secondary | ICD-10-CM | POA: Diagnosis not present

## 2023-01-29 DIAGNOSIS — Z09 Encounter for follow-up examination after completed treatment for conditions other than malignant neoplasm: Secondary | ICD-10-CM | POA: Diagnosis not present

## 2023-02-06 DIAGNOSIS — Z23 Encounter for immunization: Secondary | ICD-10-CM | POA: Diagnosis not present

## 2023-02-06 DIAGNOSIS — G4700 Insomnia, unspecified: Secondary | ICD-10-CM | POA: Diagnosis not present

## 2023-02-06 DIAGNOSIS — R945 Abnormal results of liver function studies: Secondary | ICD-10-CM | POA: Diagnosis not present

## 2023-02-06 DIAGNOSIS — Z0000 Encounter for general adult medical examination without abnormal findings: Secondary | ICD-10-CM | POA: Diagnosis not present

## 2023-02-06 DIAGNOSIS — R413 Other amnesia: Secondary | ICD-10-CM | POA: Diagnosis not present

## 2023-02-06 DIAGNOSIS — E785 Hyperlipidemia, unspecified: Secondary | ICD-10-CM | POA: Diagnosis not present

## 2023-02-06 DIAGNOSIS — E1140 Type 2 diabetes mellitus with diabetic neuropathy, unspecified: Secondary | ICD-10-CM | POA: Diagnosis not present

## 2023-02-06 DIAGNOSIS — Z1389 Encounter for screening for other disorder: Secondary | ICD-10-CM | POA: Diagnosis not present

## 2023-02-19 DIAGNOSIS — E678 Other specified hyperalimentation: Secondary | ICD-10-CM | POA: Diagnosis not present

## 2023-02-19 DIAGNOSIS — G4700 Insomnia, unspecified: Secondary | ICD-10-CM | POA: Diagnosis not present

## 2023-02-19 DIAGNOSIS — R413 Other amnesia: Secondary | ICD-10-CM | POA: Diagnosis not present

## 2023-02-19 DIAGNOSIS — Z1389 Encounter for screening for other disorder: Secondary | ICD-10-CM | POA: Diagnosis not present

## 2023-02-22 DIAGNOSIS — K439 Ventral hernia without obstruction or gangrene: Secondary | ICD-10-CM | POA: Diagnosis not present

## 2023-03-04 ENCOUNTER — Encounter: Payer: Self-pay | Admitting: *Deleted

## 2023-03-19 DIAGNOSIS — Z0000 Encounter for general adult medical examination without abnormal findings: Secondary | ICD-10-CM | POA: Diagnosis not present

## 2023-03-19 DIAGNOSIS — G4700 Insomnia, unspecified: Secondary | ICD-10-CM | POA: Diagnosis not present

## 2023-03-19 DIAGNOSIS — Z1389 Encounter for screening for other disorder: Secondary | ICD-10-CM | POA: Diagnosis not present

## 2023-03-19 DIAGNOSIS — M8580 Other specified disorders of bone density and structure, unspecified site: Secondary | ICD-10-CM | POA: Diagnosis not present

## 2023-03-20 DIAGNOSIS — H2511 Age-related nuclear cataract, right eye: Secondary | ICD-10-CM | POA: Diagnosis not present

## 2023-03-20 DIAGNOSIS — H2512 Age-related nuclear cataract, left eye: Secondary | ICD-10-CM | POA: Diagnosis not present

## 2023-03-25 ENCOUNTER — Other Ambulatory Visit: Payer: Self-pay | Admitting: Family Medicine

## 2023-03-25 DIAGNOSIS — Z780 Asymptomatic menopausal state: Secondary | ICD-10-CM

## 2023-03-25 DIAGNOSIS — Z1389 Encounter for screening for other disorder: Secondary | ICD-10-CM | POA: Diagnosis not present

## 2023-03-25 DIAGNOSIS — M79602 Pain in left arm: Secondary | ICD-10-CM | POA: Diagnosis not present

## 2023-03-27 ENCOUNTER — Encounter: Payer: Self-pay | Admitting: Ophthalmology

## 2023-03-27 DIAGNOSIS — H2511 Age-related nuclear cataract, right eye: Secondary | ICD-10-CM | POA: Diagnosis not present

## 2023-03-27 NOTE — Discharge Instructions (Signed)
El cuidado despus de una operacin de cataratas Cataract Surgery, Care After (Spanish)  Esta hoja le da informacin sobre cmo cuidarse despus de su ciruga. Su oftalmlogo puede darle instrucciones ms especficas tambin. Si tiene problemas o preguntas, comunquese con su doctor en el [facility], [phone].  Qu puedo esperar despus de la ciruga? Es normal tener: Picazn Sensacin de tener un cuerpo extrao (se siente como un grano de arena en el ojo) Secrecin acuosa (lagrimeo excesivo) Sensibilidad a la luz y al tacto Moretones en el ojo o a su alrededor Visin ligeramente borrosa  Siga estas instrucciones en casa: No se toque ni se frote los ojos. Es posible que le digan que use una pantalla protectora o lentes de sol para proteger sus ojos. No se ponga un lente de contacto en el ojo operado hasta que su doctor lo apruebe. Mantenga los prpados y la cara limpios y secos. No deje que el agua le caiga directamente en la cara mientras se duche. Evite el jabn y champ en los ojos. No se maquille los ojos por una semana.  Revsese su ojo todos los das para ver si hay  signos de infeccin. Est atento(a): Enrojecimiento, hinchazn o dolor. Lquido, sangre o pus. Empeoramiento de la visin. Aumento de la sensibilidad a la luz o al tacto.  Actividad: Durante el primer da, evite agacharse y leer. Puede volver a leer y a agacharse al da siguiente. No maneje ni use maquinaria pesada durante al menos 24 horas. Evite las actividades vigorosas durante una semana. Est bien realizar actividades como caminar, usar la cinta de correr, usar la bicicleta esttica y subir las escaleras. No levante objetos pesados (ms de 20 libras) por una semana. No realice trabajos de jardinera ni tareas domsticas sucias en la casa (como limpiar los pisos, los baos, pasar la aspiradora, etc.) durante una semana. No nade ni use un jacuzzi durante 2 semanas.  Pregunte a su doctor cundo  usted puede volver a trabajar.  Instrucciones generales: Tome o aplique los medicamentos recetados y de venta libre segn le indique su doctor, incluyendo las gotas para los ojos y las pomadas. Contine tomando los medicamentos que fueron suspendidos antes de la ciruga, a menos que su doctor le indique lo contrario. Vaya a todas sus citas de seguimiento que ya fueron programadas.   Pngase en contacto con un proveedor de atencin mdica si: Le aparecen ms moretones alrededor del ojo. Tiene dolor que no se alivia con el medicamento. Tiene fiebre. Le sale lquido, pus o sangre del ojo o de la incisin. Su sensibilidad a la luz empeora. Tiene manchas (moscas volantes) o destellos de luz en la vista. Tiene nuseas o vmitos.  Vaya al Departamento de Emergencias ms cercana o llame al 911 si: Pierde la vista de forma repentina. Tiene un dolor de ojo que es intenso y que se est empeorando. 

## 2023-04-01 ENCOUNTER — Other Ambulatory Visit: Payer: Self-pay | Admitting: Family Medicine

## 2023-04-01 DIAGNOSIS — Z1231 Encounter for screening mammogram for malignant neoplasm of breast: Secondary | ICD-10-CM

## 2023-04-01 DIAGNOSIS — Z780 Asymptomatic menopausal state: Secondary | ICD-10-CM

## 2023-04-02 ENCOUNTER — Ambulatory Visit
Admission: RE | Admit: 2023-04-02 | Discharge: 2023-04-02 | Disposition: A | Discharge status: Home / Self Care | Payer: Medicare Other | Attending: Ophthalmology | Admitting: Ophthalmology

## 2023-04-02 ENCOUNTER — Ambulatory Visit: Payer: Medicare Other | Admitting: Anesthesiology

## 2023-04-02 ENCOUNTER — Encounter
Admission: RE | Disposition: A | Discharge status: Home / Self Care | Payer: Self-pay | Source: Home / Self Care | Attending: Ophthalmology

## 2023-04-02 ENCOUNTER — Encounter: Payer: Self-pay | Admitting: Ophthalmology

## 2023-04-02 ENCOUNTER — Other Ambulatory Visit: Payer: Self-pay

## 2023-04-02 DIAGNOSIS — Z7984 Long term (current) use of oral hypoglycemic drugs: Secondary | ICD-10-CM | POA: Insufficient documentation

## 2023-04-02 DIAGNOSIS — H2511 Age-related nuclear cataract, right eye: Secondary | ICD-10-CM | POA: Diagnosis not present

## 2023-04-02 DIAGNOSIS — M1990 Unspecified osteoarthritis, unspecified site: Secondary | ICD-10-CM | POA: Insufficient documentation

## 2023-04-02 DIAGNOSIS — E1136 Type 2 diabetes mellitus with diabetic cataract: Secondary | ICD-10-CM | POA: Insufficient documentation

## 2023-04-02 DIAGNOSIS — H2589 Other age-related cataract: Secondary | ICD-10-CM | POA: Diagnosis not present

## 2023-04-02 DIAGNOSIS — E785 Hyperlipidemia, unspecified: Secondary | ICD-10-CM | POA: Diagnosis not present

## 2023-04-02 HISTORY — PX: CATARACT EXTRACTION W/PHACO: SHX586

## 2023-04-02 HISTORY — DX: Unilateral inguinal hernia, without obstruction or gangrene, not specified as recurrent: K40.90

## 2023-04-02 HISTORY — DX: Presence of dental prosthetic device (complete) (partial): Z97.2

## 2023-04-02 LAB — GLUCOSE, CAPILLARY: Glucose-Capillary: 121 mg/dL — ABNORMAL HIGH (ref 70–99)

## 2023-04-02 SURGERY — PHACOEMULSIFICATION, CATARACT, WITH IOL INSERTION
Anesthesia: Monitor Anesthesia Care | Site: Eye | Laterality: Right

## 2023-04-02 MED ORDER — FENTANYL CITRATE (PF) 100 MCG/2ML IJ SOLN
INTRAMUSCULAR | Status: DC | PRN
  Administered 2023-04-02: 50 ug via INTRAVENOUS

## 2023-04-02 MED ORDER — MIDAZOLAM HCL 2 MG/2ML IJ SOLN
INTRAMUSCULAR | Status: DC | PRN
  Administered 2023-04-02: 2 mg via INTRAVENOUS

## 2023-04-02 MED ORDER — TETRACAINE HCL 0.5 % OP SOLN
1.0000 [drp] | OPHTHALMIC | Status: DC | PRN
  Administered 2023-04-02 (×3): 1 [drp] via OPHTHALMIC

## 2023-04-02 MED ORDER — LACTATED RINGERS IV SOLN: INTRAVENOUS | Status: DC

## 2023-04-02 MED ORDER — SIGHTPATH DOSE#1 BSS IO SOLN
INTRAOCULAR | Status: DC | PRN
  Administered 2023-04-02: 2 mL

## 2023-04-02 MED ORDER — SIGHTPATH DOSE#1 NA CHONDROIT SULF-NA HYALURON 40-17 MG/ML IO SOLN
INTRAOCULAR | Status: DC | PRN
  Administered 2023-04-02: 1 mL via INTRAOCULAR

## 2023-04-02 MED ORDER — MOXIFLOXACIN HCL 0.5 % OP SOLN
OPHTHALMIC | Status: DC | PRN
  Administered 2023-04-02: .2 mL via OPHTHALMIC

## 2023-04-02 MED ORDER — SIGHTPATH DOSE#1 BSS IO SOLN
INTRAOCULAR | Status: DC | PRN
  Administered 2023-04-02: 15 mL via INTRAOCULAR

## 2023-04-02 MED ORDER — BRIMONIDINE TARTRATE-TIMOLOL 0.2-0.5 % OP SOLN
OPHTHALMIC | Status: DC | PRN
  Administered 2023-04-02: 1 [drp] via OPHTHALMIC

## 2023-04-02 MED ORDER — ARMC OPHTHALMIC DILATING DROPS
1.0000 | OPHTHALMIC | Status: DC | PRN
  Administered 2023-04-02 (×3): 1 via OPHTHALMIC

## 2023-04-02 MED ORDER — SIGHTPATH DOSE#1 BSS IO SOLN
INTRAOCULAR | Status: DC | PRN
  Administered 2023-04-02: 47 mL via OPHTHALMIC

## 2023-04-02 MED ORDER — TRYPAN BLUE 0.06 % IO SOSY
PREFILLED_SYRINGE | INTRAOCULAR | Status: DC | PRN
  Administered 2023-04-02: .5 mL via INTRAOCULAR

## 2023-04-02 SURGICAL SUPPLY — 13 items
ANGLE REVERSE CUT SHRT 25GA (CUTTER) ×1
CANNULA ANT/CHMB 27G (MISCELLANEOUS) IMPLANT
CANNULA ANT/CHMB 27GA (MISCELLANEOUS) ×1
CATARACT SUITE SIGHTPATH (MISCELLANEOUS) ×1
CYSTOTOME ANGL RVRS SHRT 25G (CUTTER) ×1 IMPLANT
FEE CATARACT SUITE SIGHTPATH (MISCELLANEOUS) ×1 IMPLANT
GLOVE BIOGEL PI IND STRL 8 (GLOVE) ×1 IMPLANT
GLOVE SURG ENC TEXT LTX SZ8 (GLOVE) ×1 IMPLANT
LENS IOL TECNIS EYHANCE 21.0 (Intraocular Lens) IMPLANT
NDL FILTER BLUNT 18X1 1/2 (NEEDLE) ×1 IMPLANT
NEEDLE FILTER BLUNT 18X1 1/2 (NEEDLE) ×1
RING MALYGIN (MISCELLANEOUS) IMPLANT
SYR 3ML LL SCALE MARK (SYRINGE) ×1 IMPLANT

## 2023-04-02 NOTE — Anesthesia Preprocedure Evaluation (Addendum)
Anesthesia Evaluation  Patient identified by MRN, date of birth, ID band Patient awake    Reviewed: Allergy & Precautions, NPO status , Patient's Chart, lab work & pertinent test results  History of Anesthesia Complications Negative for: history of anesthetic complications  Airway Mallampati: IV   Neck ROM: Full    Dental  (+) Upper Dentures, Partial Lower   Pulmonary neg pulmonary ROS   Pulmonary exam normal breath sounds clear to auscultation       Cardiovascular Exercise Tolerance: Good negative cardio ROS Normal cardiovascular exam Rhythm:Regular Rate:Normal     Neuro/Psych negative neurological ROS     GI/Hepatic negative GI ROS, Neg liver ROS,,,  Endo/Other  diabetes, Type 2    Renal/GU negative Renal ROS     Musculoskeletal  (+) Arthritis ,    Abdominal   Peds  Hematology negative hematology ROS (+)   Anesthesia Other Findings   Reproductive/Obstetrics                             Anesthesia Physical Anesthesia Plan  ASA: 2  Anesthesia Plan: MAC   Post-op Pain Management:    Induction: Intravenous  PONV Risk Score and Plan: 2 and Treatment may vary due to age or medical condition, Midazolam and TIVA  Airway Management Planned: Natural Airway and Nasal Cannula  Additional Equipment:   Intra-op Plan:   Post-operative Plan:   Informed Consent: I have reviewed the patients History and Physical, chart, labs and discussed the procedure including the risks, benefits and alternatives for the proposed anesthesia with the patient or authorized representative who has indicated his/her understanding and acceptance.     Dental advisory given  Plan Discussed with: CRNA  Anesthesia Plan Comments: (History and consent obtained in Spanish, with video interpreter available if needed.  LMA/GETA backup discussed.  Patient consented for risks of anesthesia including but not limited  to:  - adverse reactions to medications - damage to eyes, teeth, lips or other oral mucosa - nerve damage due to positioning  - sore throat or hoarseness - damage to heart, brain, nerves, lungs, other parts of body or loss of life  Informed patient about role of CRNA in peri- and intra-operative care.  Patient voiced understanding.)       Anesthesia Quick Evaluation

## 2023-04-02 NOTE — Op Note (Signed)
PREOPERATIVE DIAGNOSIS:  Nuclear sclerotic cataract of the right eye.   POSTOPERATIVE DIAGNOSIS:  Cataract   OPERATIVE PROCEDURE:ORPROCALL@   SURGEON:  Galen Manila, MD.   ANESTHESIA:  Anesthesiologist: Reed Breech, MD CRNA: Bynum, [place], CRNA  1.      Managed anesthesia care. 2.      0.90ml of Shugarcaine was instilled in the eye following the paracentesis.   COMPLICATIONS:Vision Blue was used to stain the anterior capsule due to very poor/ no visualization of the red reflex.    TECHNIQUE:   Stop and chop   DESCRIPTION OF PROCEDURE:  The patient was examined and consented in the preoperative holding area where the aforementioned topical anesthesia was applied to the right eye and then brought back to the Operating Room where the right eye was prepped and draped in the usual sterile ophthalmic fashion and a lid speculum was placed. A paracentesis was created with the side port blade and the anterior chamber was filled with viscoelastic. A near clear corneal incision was performed with the steel keratome. A continuous curvilinear capsulorrhexis was performed with a cystotome followed by the capsulorrhexis forceps. Hydrodissection and hydrodelineation were carried out with BSS on a blunt cannula. The lens was removed in a stop and chop  technique and the remaining cortical material was removed with the irrigation-aspiration handpiece. The capsular bag was inflated with viscoelastic and the Technis ZCB00  lens was placed in the capsular bag without complication. The remaining viscoelastic was removed from the eye with the irrigation-aspiration handpiece. The wounds were hydrated. The anterior chamber was flushed with BSS and the eye was inflated to physiologic pressure. 0.96ml of Vigamox was placed in the anterior chamber. The wounds were found to be water tight. The eye was dressed with Combigan. The patient was given protective glasses to wear throughout the day and a shield with which to  sleep tonight. The patient was also given drops with which to begin a drop regimen today and will follow-up with me in one day. Implant Name Type Inv. Item Serial No. Manufacturer Lot No. LRB No. Used Action  LENS IOL TECNIS EYHANCE 21.0 - [id number] Intraocular Lens LENS IOL TECNIS EYHANCE 21.0 [id number] [employer]  Right 1 Implanted   Procedure(s): CATARACT EXTRACTION PHACO AND INTRAOCULAR LENS PLACEMENT (IOC) RIGHT DIABETIC VISION BLUE 5.06 00:36.4 (Right)  Electronically signed: Galen Manila 04/02/2023 9:41 AM

## 2023-04-02 NOTE — Transfer of Care (Signed)
Immediate Anesthesia Transfer of Care Note  Patient: Leah Harris  Procedure(s) Performed: CATARACT EXTRACTION PHACO AND INTRAOCULAR LENS PLACEMENT (IOC) RIGHT DIABETIC VISION BLUE 5.06 00:36.4 (Right: Eye)  Patient Location: PACU  Anesthesia Type: MAC  Level of Consciousness: awake, alert  and patient cooperative  Airway and Oxygen Therapy: Patient Spontanous Breathing and Patient connected to supplemental oxygen  Post-op Assessment: Post-op Vital signs reviewed, Patient's Cardiovascular Status Stable, Respiratory Function Stable, Patent Airway and No signs of Nausea or vomiting  Post-op Vital Signs: Reviewed and stable  Complications: No notable events documented.

## 2023-04-02 NOTE — H&P (Signed)
[facility]   Primary Care Physician:  Neita Goodnight Presley Raddle, MD Ophthalmologist: Dr. Maren Reamer  Pre-Procedure History & Physical: HPI:  Leah Harris is a 67 y.o. female here for cataract surgery.   Past Medical History:  Diagnosis Date   Diabetes (HCC)    Hyperlipidemia    Inguinal hernia    Osteoarthritis    Vaginal prolapse    Wears dentures    Partial upper and lower    Past Surgical History:  Procedure Laterality Date   ABDOMINAL HYSTERECTOMY  2014   AUGMENTATION MAMMAPLASTY Bilateral 2008   saline   AUGMENTATION MAMMAPLASTY Bilateral 11/2020   had implants replaced   BUNIONECTOMY Left 09/2021   COLPORRHAPHY  12/17/2022   CYSTOURETHROSCOPY  12/17/2022   ROBOTIC ASSISTED LAPAROSCOPIC LYSIS OF ADHESION  12/17/2022    Prior to Admission medications   Medication Sig Start Date End Date Taking? Authorizing Provider  Cholecalciferol (VITAMIN D3) 25 MCG (1000 UT) CAPS Take 1,000 Units by mouth daily.   Yes [provider]  escitalopram (LEXAPRO) 10 MG tablet Take 10 mg by mouth daily.   Yes [provider]  hydrOXYzine (ATARAX) 25 MG tablet Take 25 mg by mouth at bedtime as needed.   Yes [provider]  MAGNESIUM PO Take 525 mg by mouth daily.   Yes [provider]  metFORMIN (GLUCOPHAGE) 500 MG tablet Take 500 mg by mouth 2 (two) times daily. 08/17/21  Yes [provider]  Multiple Vitamin (MULTIVITAMIN) tablet Take 1 tablet by mouth daily.   Yes [provider]  oxyCODONE-acetaminophen (PERCOCET) 5-325 MG tablet Take 1 tablet by mouth every 4 (four) hours as needed for severe pain. 10/12/21  Yes Felecia Shelling, DPM  traZODone (DESYREL) 50 MG tablet Take 50 mg by mouth at bedtime as needed for sleep.   Yes [provider]  acetaminophen (TYLENOL) 500 MG tablet Take by mouth.    [provider]  Salicylic Acid 17.6 % LIQD Apply 1 drop topically daily. Patient not taking: Reported on  03/27/2023 08/30/21   Orvil Feil, PA-C  SODIUM FLUORIDE 5000 ENAMEL 1.1-5 % GEL Take by mouth. Patient not taking: Reported on 03/27/2023 07/17/21   [provider]    Allergies as of 03/19/2023   (No Known Allergies)    Family History  Problem Relation Age of Onset   Hypertension Mother    Asthma Mother        Deceased   Heart disease Father        Deceased   Healthy Child         x 5   Thyroid disease Daughter    Breast cancer Neg Hx     Social History   Socioeconomic History   Marital status: Single    Spouse name: Not on file   Number of children: Not on file   Years of education: Not on file   Highest education level: Not on file  Occupational History   Not on file  Tobacco Use   Smoking status: Never    Passive exposure: Never   Smokeless tobacco: Never  Vaping Use   Vaping status: Never Used  Substance and Sexual Activity   Alcohol use: No    Alcohol/week: 0.0 standard drinks of alcohol   Drug use: No   Sexual activity: Not on file  Other Topics Concern   Not on file  Social History Narrative   Lives alone in a one story home.  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  Education: 4th grade.   Social Determinants of Health   Financial Resource Strain: Not on file  Food Insecurity: Not on file  Transportation Needs: Not on file  Physical Activity: Not on file  Stress: Not on file  Social Connections: Not on file  Intimate Partner Violence: Not on file    Review of Systems: See HPI, otherwise negative ROS  Physical Exam: BP 135/77   Temp (!) 97.5 F (36.4 C)   Resp 13   Ht 5' (1.524 m)   Wt 61.4 kg   LMP  (LMP Unknown)   SpO2 96%   BMI 26.42 kg/m  General:   Alert, cooperative in NAD Head:  Normocephalic and atraumatic. Respiratory:  Normal work of breathing. Cardiovascular:  RRR  Impression/Plan: Leah Harris is here for cataract surgery.  Risks, benefits, limitations, and alternatives regarding cataract surgery have been  reviewed with the patient.  Questions have been answered.  All parties agreeable.   Galen Manila, MD  04/02/2023, 9:17 AM

## 2023-04-02 NOTE — Anesthesia Postprocedure Evaluation (Signed)
Anesthesia Post Note  Patient: Leah Harris  Procedure(s) Performed: CATARACT EXTRACTION PHACO AND INTRAOCULAR LENS PLACEMENT (IOC) RIGHT DIABETIC VISION BLUE 5.06 00:36.4 (Right: Eye)  Patient location during evaluation: PACU Anesthesia Type: MAC Level of consciousness: awake and alert, oriented and patient cooperative Pain management: pain level controlled Vital Signs Assessment: post-procedure vital signs reviewed and stable Respiratory status: spontaneous breathing, nonlabored ventilation and respiratory function stable Cardiovascular status: blood pressure returned to baseline and stable Postop Assessment: adequate PO intake Anesthetic complications: no   No notable events documented.   Last Vitals:  Vitals:   04/02/23 0945 04/02/23 0950  BP: 108/72 106/66  Pulse: (!) 54 (!) 56  Resp: 10 14  Temp:    SpO2: 97% 98%    Last Pain:  Vitals:   04/02/23 0950  PainSc: 0-No pain                 Reed Breech

## 2023-04-03 ENCOUNTER — Encounter: Payer: Self-pay | Admitting: Ophthalmology

## 2023-04-08 DIAGNOSIS — M170 Bilateral primary osteoarthritis of knee: Secondary | ICD-10-CM | POA: Diagnosis not present

## 2023-04-08 DIAGNOSIS — H2512 Age-related nuclear cataract, left eye: Secondary | ICD-10-CM | POA: Diagnosis not present

## 2023-04-08 NOTE — Anesthesia Preprocedure Evaluation (Addendum)
Anesthesia Evaluation  Patient identified by MRN, date of birth, ID band Patient awake    Reviewed: Allergy & Precautions, H&P , NPO status , Patient's Chart, lab work & pertinent test results  Airway Mallampati: IV  TM Distance: >3 FB Neck ROM: Full    Dental no notable dental hx. (+) Upper Dentures, Partial Lower   Pulmonary neg pulmonary ROS   Pulmonary exam normal breath sounds clear to auscultation       Cardiovascular negative cardio ROS Normal cardiovascular exam Rhythm:Regular Rate:Normal     Neuro/Psych  PSYCHIATRIC DISORDERS  Depression     Neuromuscular disease negative neurological ROS  negative psych ROS   GI/Hepatic negative GI ROS, Neg liver ROS,,,  Endo/Other  negative endocrine ROSdiabetes    Renal/GU negative Renal ROS  negative genitourinary   Musculoskeletal negative musculoskeletal ROS (+) Arthritis ,    Abdominal   Peds negative pediatric ROS (+)  Hematology negative hematology ROS (+)   Anesthesia Other Findings Diabetes (HCC)  Hyperlipidemia Vaginal prolapse  Osteoarthritis Inguinal hernia  Wears dentures Low back pain Previous cataract Dr. Ronni Rumble 04-02-23  Reproductive/Obstetrics negative OB ROS                              Anesthesia Physical Anesthesia Plan  ASA: 2  Anesthesia Plan: MAC   Post-op Pain Management:    Induction: Intravenous  PONV Risk Score and Plan:   Airway Management Planned: Natural Airway and Nasal Cannula  Additional Equipment:   Intra-op Plan:   Post-operative Plan:   Informed Consent: I have reviewed the patients History and Physical, chart, labs and discussed the procedure including the risks, benefits and alternatives for the proposed anesthesia with the patient or authorized representative who has indicated his/her understanding and acceptance.     Dental Advisory Given  Plan Discussed with: Anesthesiologist,  CRNA and Surgeon  Anesthesia Plan Comments: (Patient consented for risks of anesthesia including but not limited to:  - adverse reactions to medications - damage to eyes, teeth, lips or other oral mucosa - nerve damage due to positioning  - sore throat or hoarseness - Damage to heart, brain, nerves, lungs, other parts of body or loss of life  Patient voiced understanding.)         Anesthesia Quick Evaluation

## 2023-04-09 DIAGNOSIS — E1140 Type 2 diabetes mellitus with diabetic neuropathy, unspecified: Secondary | ICD-10-CM | POA: Diagnosis not present

## 2023-04-09 DIAGNOSIS — Z1389 Encounter for screening for other disorder: Secondary | ICD-10-CM | POA: Diagnosis not present

## 2023-04-09 DIAGNOSIS — Z1211 Encounter for screening for malignant neoplasm of colon: Secondary | ICD-10-CM | POA: Diagnosis not present

## 2023-04-09 DIAGNOSIS — E785 Hyperlipidemia, unspecified: Secondary | ICD-10-CM | POA: Diagnosis not present

## 2023-04-12 NOTE — Discharge Instructions (Signed)

## 2023-04-16 ENCOUNTER — Other Ambulatory Visit: Payer: Self-pay

## 2023-04-16 ENCOUNTER — Ambulatory Visit: Payer: Medicare Other | Admitting: Anesthesiology

## 2023-04-16 ENCOUNTER — Ambulatory Visit
Admission: RE | Admit: 2023-04-16 | Discharge: 2023-04-16 | Disposition: A | Discharge status: Home / Self Care | Payer: Medicare Other | Attending: Ophthalmology | Admitting: Ophthalmology

## 2023-04-16 ENCOUNTER — Encounter
Admission: RE | Disposition: A | Discharge status: Home / Self Care | Payer: Self-pay | Source: Home / Self Care | Attending: Ophthalmology

## 2023-04-16 DIAGNOSIS — Z9841 Cataract extraction status, right eye: Secondary | ICD-10-CM | POA: Insufficient documentation

## 2023-04-16 DIAGNOSIS — E1136 Type 2 diabetes mellitus with diabetic cataract: Secondary | ICD-10-CM | POA: Insufficient documentation

## 2023-04-16 DIAGNOSIS — H2512 Age-related nuclear cataract, left eye: Secondary | ICD-10-CM | POA: Diagnosis not present

## 2023-04-16 DIAGNOSIS — Z961 Presence of intraocular lens: Secondary | ICD-10-CM | POA: Insufficient documentation

## 2023-04-16 DIAGNOSIS — E785 Hyperlipidemia, unspecified: Secondary | ICD-10-CM | POA: Diagnosis not present

## 2023-04-16 DIAGNOSIS — Z7984 Long term (current) use of oral hypoglycemic drugs: Secondary | ICD-10-CM | POA: Diagnosis not present

## 2023-04-16 DIAGNOSIS — M1990 Unspecified osteoarthritis, unspecified site: Secondary | ICD-10-CM | POA: Diagnosis not present

## 2023-04-16 DIAGNOSIS — F32A Depression, unspecified: Secondary | ICD-10-CM | POA: Diagnosis not present

## 2023-04-16 DIAGNOSIS — H2589 Other age-related cataract: Secondary | ICD-10-CM | POA: Diagnosis not present

## 2023-04-16 HISTORY — PX: CATARACT EXTRACTION W/PHACO: SHX586

## 2023-04-16 LAB — GLUCOSE, CAPILLARY: Glucose-Capillary: 115 mg/dL — ABNORMAL HIGH (ref 70–99)

## 2023-04-16 SURGERY — PHACOEMULSIFICATION, CATARACT, WITH IOL INSERTION
Anesthesia: Monitor Anesthesia Care | Site: Eye | Laterality: Left

## 2023-04-16 MED ORDER — TRYPAN BLUE 0.06 % IO SOSY
PREFILLED_SYRINGE | INTRAOCULAR | Status: DC | PRN
  Administered 2023-04-16: .5 mL via INTRAOCULAR

## 2023-04-16 MED ORDER — SIGHTPATH DOSE#1 BSS IO SOLN
INTRAOCULAR | Status: DC | PRN
  Administered 2023-04-16: 83 mL via OPHTHALMIC

## 2023-04-16 MED ORDER — FENTANYL CITRATE (PF) 100 MCG/2ML IJ SOLN
INTRAMUSCULAR | Status: DC | PRN
  Administered 2023-04-16: 50 ug via INTRAVENOUS

## 2023-04-16 MED ORDER — ARMC OPHTHALMIC DILATING DROPS
1.0000 | OPHTHALMIC | Status: DC | PRN
  Administered 2023-04-16 (×3): 1 via OPHTHALMIC

## 2023-04-16 MED ORDER — TETRACAINE HCL 0.5 % OP SOLN
1.0000 [drp] | OPHTHALMIC | Status: DC | PRN
  Administered 2023-04-16 (×3): 1 [drp] via OPHTHALMIC

## 2023-04-16 MED ORDER — MOXIFLOXACIN HCL 0.5 % OP SOLN
OPHTHALMIC | Status: DC | PRN
  Administered 2023-04-16: .2 mL via OPHTHALMIC

## 2023-04-16 MED ORDER — SIGHTPATH DOSE#1 BSS IO SOLN
INTRAOCULAR | Status: DC | PRN
  Administered 2023-04-16: 15 mL via INTRAOCULAR

## 2023-04-16 MED ORDER — SIGHTPATH DOSE#1 BSS IO SOLN
INTRAOCULAR | Status: DC | PRN
  Administered 2023-04-16: 2 mL

## 2023-04-16 MED ORDER — MIDAZOLAM HCL 2 MG/2ML IJ SOLN
INTRAMUSCULAR | Status: DC | PRN
  Administered 2023-04-16: 2 mg via INTRAVENOUS

## 2023-04-16 MED ORDER — SIGHTPATH DOSE#1 NA CHONDROIT SULF-NA HYALURON 40-17 MG/ML IO SOLN
INTRAOCULAR | Status: DC | PRN
  Administered 2023-04-16: 1 mL via INTRAOCULAR

## 2023-04-16 MED ORDER — LACTATED RINGERS IV SOLN: INTRAVENOUS | Status: DC

## 2023-04-16 MED ORDER — BRIMONIDINE TARTRATE-TIMOLOL 0.2-0.5 % OP SOLN
OPHTHALMIC | Status: DC | PRN
  Administered 2023-04-16: 1 [drp] via OPHTHALMIC

## 2023-04-16 SURGICAL SUPPLY — 12 items
ANGLE REVERSE CUT SHRT 25GA (CUTTER) ×1
CANNULA ANT/CHMB 27G (MISCELLANEOUS) IMPLANT
CANNULA ANT/CHMB 27GA (MISCELLANEOUS) ×1
CATARACT SUITE SIGHTPATH (MISCELLANEOUS) ×1
CYSTOTOME ANGL RVRS SHRT 25G (CUTTER) ×1 IMPLANT
FEE CATARACT SUITE SIGHTPATH (MISCELLANEOUS) ×1 IMPLANT
GLOVE BIOGEL PI IND STRL 8 (GLOVE) ×1 IMPLANT
GLOVE SURG LX STRL 8.0 MICRO (GLOVE) ×1 IMPLANT
LENS IOL TECNIS EYHANCE 21.5 (Intraocular Lens) IMPLANT
NDL FILTER BLUNT 18X1 1/2 (NEEDLE) ×1 IMPLANT
NEEDLE FILTER BLUNT 18X1 1/2 (NEEDLE) ×1
SYR 3ML LL SCALE MARK (SYRINGE) ×1 IMPLANT

## 2023-04-16 NOTE — Anesthesia Postprocedure Evaluation (Signed)
Anesthesia Post Note  Patient: Leah Harris  Procedure(s) Performed: CATARACT EXTRACTION PHACO AND INTRAOCULAR LENS PLACEMENT (IOC) LEFT DIABETIC VISION BLUE 4.01 00:31.5 (Left: Eye)  Patient location during evaluation: PACU Anesthesia Type: MAC Level of consciousness: awake and alert Pain management: pain level controlled Vital Signs Assessment: post-procedure vital signs reviewed and stable Respiratory status: spontaneous breathing, nonlabored ventilation, respiratory function stable and patient connected to nasal cannula oxygen Cardiovascular status: stable and blood pressure returned to baseline Postop Assessment: no apparent nausea or vomiting Anesthetic complications: no   No notable events documented.   Last Vitals:  Vitals:   04/16/23 0746 04/16/23 0752  BP: 117/68 116/71  Pulse: (!) 55 (!) 52  Resp: 11 13  Temp: (!) 36.3 C (!) 36.3 C  SpO2: 98% 96%    Last Pain:  Vitals:   04/16/23 0746  TempSrc:   PainSc: 2                  Keysean Savino C Delcie Ruppert

## 2023-04-16 NOTE — H&P (Signed)
[facility]   Primary Care Physician:  Neita Goodnight Presley Raddle, MD Ophthalmologist: Dr. Druscilla Brownie  Pre-Procedure History & Physical: HPI:  Leah Harris is a 67 y.o. female here for cataract surgery.   Past Medical History:  Diagnosis Date   Diabetes (HCC)    Hyperlipidemia    Inguinal hernia    Osteoarthritis    Vaginal prolapse    Wears dentures    Partial upper and lower    Past Surgical History:  Procedure Laterality Date   ABDOMINAL HYSTERECTOMY  2014   AUGMENTATION MAMMAPLASTY Bilateral 2008   saline   AUGMENTATION MAMMAPLASTY Bilateral 11/2020   had implants replaced   BUNIONECTOMY Left 09/2021   CATARACT EXTRACTION W/PHACO Right 04/02/2023   Procedure: CATARACT EXTRACTION PHACO AND INTRAOCULAR LENS PLACEMENT (IOC) RIGHT DIABETIC VISION BLUE 5.06 00:36.4;  Surgeon: Galen Manila, MD;  Location: [facility];  Service: Ophthalmology;  Laterality: Right;   COLPORRHAPHY  12/17/2022   CYSTOURETHROSCOPY  12/17/2022   ROBOTIC ASSISTED LAPAROSCOPIC LYSIS OF ADHESION  12/17/2022    Prior to Admission medications   Medication Sig Start Date End Date Taking? Authorizing Provider  acetaminophen (TYLENOL) 500 MG tablet Take by mouth.   Yes [provider]  Cholecalciferol (VITAMIN D3) 25 MCG (1000 UT) CAPS Take 1,000 Units by mouth daily.   Yes [provider]  escitalopram (LEXAPRO) 10 MG tablet Take 10 mg by mouth daily.   Yes [provider]  hydrOXYzine (ATARAX) 25 MG tablet Take 25 mg by mouth at bedtime as needed.   Yes [provider]  MAGNESIUM PO Take 525 mg by mouth daily.   Yes [provider]  metFORMIN (GLUCOPHAGE) 500 MG tablet Take 500 mg by mouth 2 (two) times daily. 08/17/21  Yes [provider]  Multiple Vitamin (MULTIVITAMIN) tablet Take 1 tablet by mouth daily.   Yes [provider]  oxyCODONE-acetaminophen (PERCOCET) 5-325 MG tablet Take 1 tablet by mouth every 4 (four)  hours as needed for severe pain. 10/12/21  Yes Felecia Shelling, DPM  traZODone (DESYREL) 50 MG tablet Take 50 mg by mouth at bedtime as needed for sleep.   Yes [provider]  Salicylic Acid 17.6 % LIQD Apply 1 drop topically daily. Patient not taking: Reported on 03/27/2023 08/30/21   Orvil Feil, PA-C  SODIUM FLUORIDE 5000 ENAMEL 1.1-5 % GEL Take by mouth. Patient not taking: Reported on 03/27/2023 07/17/21   [provider]    Allergies as of 03/19/2023   (No Known Allergies)    Family History  Problem Relation Age of Onset   Hypertension Mother    Asthma Mother        Deceased   Heart disease Father        Deceased   Healthy Child         x 5   Thyroid disease Daughter    Breast cancer Neg Hx     Social History   Socioeconomic History   Marital status: Single    Spouse name: Not on file   Number of children: Not on file   Years of education: Not on file   Highest education level: Not on file  Occupational History   Not on file  Tobacco Use   Smoking status: Never    Passive exposure: Never   Smokeless tobacco: Never  Vaping Use   Vaping status: Never Used  Substance and Sexual Activity   Alcohol use: No    Alcohol/week: 0.0 standard drinks  of alcohol   Drug use: No   Sexual activity: Not on file  Other Topics Concern   Not on file  Social History Narrative   Lives alone in a one story home.  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  Education: 4th grade.   Social Determinants of Health   Financial Resource Strain: Not on file  Food Insecurity: Not on file  Transportation Needs: Not on file  Physical Activity: Not on file  Stress: Not on file  Social Connections: Not on file  Intimate Partner Violence: Not on file    Review of Systems: See HPI, otherwise negative ROS  Physical Exam: BP 131/75   Pulse (!) 59   Temp (!) 97.2 F (36.2 C) (Temporal)   Resp (!) 22   Ht 5' (1.524 m)   Wt 63 kg   LMP  (LMP Unknown)   SpO2 98%   BMI 27.15  kg/m  General:   Alert, cooperative in NAD Head:  Normocephalic and atraumatic. Respiratory:  Normal work of breathing. Cardiovascular:  RRR  Impression/Plan: Leah Harris is here for cataract surgery.  Risks, benefits, limitations, and alternatives regarding cataract surgery have been reviewed with the patient.  Questions have been answered.  All parties agreeable.   Galen Manila, MD  04/16/2023, 7:17 AM

## 2023-04-16 NOTE — Transfer of Care (Signed)
Immediate Anesthesia Transfer of Care Note  Patient: Leah Harris  Procedure(s) Performed: CATARACT EXTRACTION PHACO AND INTRAOCULAR LENS PLACEMENT (IOC) LEFT DIABETIC VISION BLUE 4.01 00:31.5 (Left: Eye)  Patient Location: PACU  Anesthesia Type: MAC  Level of Consciousness: awake, alert  and patient cooperative  Airway and Oxygen Therapy: Patient Spontanous Breathing and Patient connected to supplemental oxygen  Post-op Assessment: Post-op Vital signs reviewed, Patient's Cardiovascular Status Stable, Respiratory Function Stable, Patent Airway and No signs of Nausea or vomiting  Post-op Vital Signs: Reviewed and stable  Complications: No notable events documented.

## 2023-04-16 NOTE — Op Note (Signed)
PREOPERATIVE DIAGNOSIS:  Nuclear sclerotic cataract of the left eye.   POSTOPERATIVE DIAGNOSIS:  Nuclear sclerotic cataract of the left eye.   OPERATIVE PROCEDURE:ORPROCALL@   SURGEON:  Galen Manila, MD.   ANESTHESIA:  Anesthesiologist: Marisue Humble, MD CRNA: Barbette Hair, CRNA  1.      Managed anesthesia care. 2.     0.25ml of Shugarcaine was instilled following the paracentesis   COMPLICATIONS: Vision Blue was used to stain the anterior capsule due to very poor/ no visualization of the red reflex.    TECHNIQUE:   Stop and chop   DESCRIPTION OF PROCEDURE:  The patient was examined and consented in the preoperative holding area where the aforementioned topical anesthesia was applied to the left eye and then brought back to the Operating Room where the left eye was prepped and draped in the usual sterile ophthalmic fashion and a lid speculum was placed. A paracentesis was created with the side port blade and the anterior chamber was filled with viscoelastic. A near clear corneal incision was performed with the steel keratome. A continuous curvilinear capsulorrhexis was performed with a cystotome followed by the capsulorrhexis forceps. Hydrodissection and hydrodelineation were carried out with BSS on a blunt cannula. The lens was removed in a stop and chop  technique and the remaining cortical material was removed with the irrigation-aspiration handpiece. The capsular bag was inflated with viscoelastic and the Technis ZCB00 lens was placed in the capsular bag without complication. The remaining viscoelastic was removed from the eye with the irrigation-aspiration handpiece. The wounds were hydrated. The anterior chamber was flushed with BSS and the eye was inflated to physiologic pressure. 0.32ml Vigamox was placed in the anterior chamber. The wounds were found to be water tight. The eye was dressed with Combigan. The patient was given protective glasses to wear throughout the day and a shield  with which to sleep tonight. The patient was also given drops with which to begin a drop regimen today and will follow-up with me in one day. Implant Name Type Inv. Item Serial No. Manufacturer Lot No. LRB No. Used Action  LENS IOL TECNIS EYHANCE 21.5 - [id number] Intraocular Lens LENS IOL TECNIS EYHANCE 21.5 [id number] [employer]  Left 1 Implanted    Procedure(s): CATARACT EXTRACTION PHACO AND INTRAOCULAR LENS PLACEMENT (IOC) LEFT DIABETIC VISION BLUE 4.01 00:31.5 (Left)  Electronically signed: Galen Manila 04/16/2023 7:45 AM

## 2023-04-17 ENCOUNTER — Encounter: Payer: Self-pay | Admitting: Ophthalmology

## 2023-04-18 DIAGNOSIS — Z01818 Encounter for other preprocedural examination: Secondary | ICD-10-CM | POA: Diagnosis not present

## 2023-05-02 DIAGNOSIS — K439 Ventral hernia without obstruction or gangrene: Secondary | ICD-10-CM | POA: Diagnosis not present

## 2023-05-02 DIAGNOSIS — K4090 Unilateral inguinal hernia, without obstruction or gangrene, not specified as recurrent: Secondary | ICD-10-CM | POA: Diagnosis not present

## 2023-05-02 DIAGNOSIS — E785 Hyperlipidemia, unspecified: Secondary | ICD-10-CM | POA: Diagnosis not present

## 2023-05-10 DIAGNOSIS — Z961 Presence of intraocular lens: Secondary | ICD-10-CM | POA: Diagnosis not present

## 2023-05-27 DIAGNOSIS — M1712 Unilateral primary osteoarthritis, left knee: Secondary | ICD-10-CM | POA: Diagnosis not present

## 2023-05-27 DIAGNOSIS — Z01812 Encounter for preprocedural laboratory examination: Secondary | ICD-10-CM | POA: Diagnosis not present

## 2023-05-30 DIAGNOSIS — Z7984 Long term (current) use of oral hypoglycemic drugs: Secondary | ICD-10-CM | POA: Diagnosis not present

## 2023-05-30 DIAGNOSIS — G8918 Other acute postprocedural pain: Secondary | ICD-10-CM | POA: Diagnosis not present

## 2023-05-30 DIAGNOSIS — M1712 Unilateral primary osteoarthritis, left knee: Secondary | ICD-10-CM | POA: Diagnosis not present

## 2023-05-30 DIAGNOSIS — E785 Hyperlipidemia, unspecified: Secondary | ICD-10-CM | POA: Diagnosis not present

## 2023-05-30 DIAGNOSIS — Z7982 Long term (current) use of aspirin: Secondary | ICD-10-CM | POA: Diagnosis not present

## 2023-05-30 DIAGNOSIS — E119 Type 2 diabetes mellitus without complications: Secondary | ICD-10-CM | POA: Diagnosis not present

## 2023-05-31 DIAGNOSIS — E119 Type 2 diabetes mellitus without complications: Secondary | ICD-10-CM | POA: Diagnosis not present

## 2023-05-31 DIAGNOSIS — Z7984 Long term (current) use of oral hypoglycemic drugs: Secondary | ICD-10-CM | POA: Diagnosis not present

## 2023-05-31 DIAGNOSIS — E785 Hyperlipidemia, unspecified: Secondary | ICD-10-CM | POA: Diagnosis not present

## 2023-05-31 DIAGNOSIS — Z7982 Long term (current) use of aspirin: Secondary | ICD-10-CM | POA: Diagnosis not present

## 2023-05-31 DIAGNOSIS — M1712 Unilateral primary osteoarthritis, left knee: Secondary | ICD-10-CM | POA: Diagnosis not present

## 2023-06-06 DIAGNOSIS — M25562 Pain in left knee: Secondary | ICD-10-CM | POA: Diagnosis not present

## 2023-06-06 DIAGNOSIS — M25662 Stiffness of left knee, not elsewhere classified: Secondary | ICD-10-CM | POA: Diagnosis not present

## 2023-06-11 DIAGNOSIS — M25562 Pain in left knee: Secondary | ICD-10-CM | POA: Diagnosis not present

## 2023-06-11 DIAGNOSIS — M1711 Unilateral primary osteoarthritis, right knee: Secondary | ICD-10-CM | POA: Diagnosis not present

## 2023-06-11 DIAGNOSIS — M25662 Stiffness of left knee, not elsewhere classified: Secondary | ICD-10-CM | POA: Diagnosis not present

## 2023-06-13 DIAGNOSIS — M25662 Stiffness of left knee, not elsewhere classified: Secondary | ICD-10-CM | POA: Diagnosis not present

## 2023-06-13 DIAGNOSIS — M25562 Pain in left knee: Secondary | ICD-10-CM | POA: Diagnosis not present

## 2023-06-18 ENCOUNTER — Ambulatory Visit
Admission: RE | Admit: 2023-06-18 | Discharge: 2023-06-18 | Disposition: A | Discharge status: Home / Self Care | Payer: Medicare Other | Source: Ambulatory Visit | Attending: Family Medicine | Admitting: Family Medicine

## 2023-06-18 DIAGNOSIS — M25562 Pain in left knee: Secondary | ICD-10-CM | POA: Diagnosis not present

## 2023-06-18 DIAGNOSIS — Z780 Asymptomatic menopausal state: Secondary | ICD-10-CM | POA: Insufficient documentation

## 2023-06-18 DIAGNOSIS — M25662 Stiffness of left knee, not elsewhere classified: Secondary | ICD-10-CM | POA: Diagnosis not present

## 2023-06-18 DIAGNOSIS — M8588 Other specified disorders of bone density and structure, other site: Secondary | ICD-10-CM | POA: Diagnosis not present

## 2023-06-20 DIAGNOSIS — M25562 Pain in left knee: Secondary | ICD-10-CM | POA: Diagnosis not present

## 2023-06-20 DIAGNOSIS — M25662 Stiffness of left knee, not elsewhere classified: Secondary | ICD-10-CM | POA: Diagnosis not present

## 2023-06-25 DIAGNOSIS — M25662 Stiffness of left knee, not elsewhere classified: Secondary | ICD-10-CM | POA: Diagnosis not present

## 2023-06-25 DIAGNOSIS — M25562 Pain in left knee: Secondary | ICD-10-CM | POA: Diagnosis not present

## 2023-06-25 DIAGNOSIS — Z96652 Presence of left artificial knee joint: Secondary | ICD-10-CM | POA: Diagnosis not present

## 2023-06-27 DIAGNOSIS — M25662 Stiffness of left knee, not elsewhere classified: Secondary | ICD-10-CM | POA: Diagnosis not present

## 2023-06-27 DIAGNOSIS — M25562 Pain in left knee: Secondary | ICD-10-CM | POA: Diagnosis not present

## 2023-07-01 DIAGNOSIS — Z96652 Presence of left artificial knee joint: Secondary | ICD-10-CM | POA: Diagnosis not present

## 2023-07-02 DIAGNOSIS — M25662 Stiffness of left knee, not elsewhere classified: Secondary | ICD-10-CM | POA: Diagnosis not present

## 2023-07-02 DIAGNOSIS — M25562 Pain in left knee: Secondary | ICD-10-CM | POA: Diagnosis not present

## 2023-07-04 DIAGNOSIS — M25562 Pain in left knee: Secondary | ICD-10-CM | POA: Diagnosis not present

## 2023-07-04 DIAGNOSIS — M25662 Stiffness of left knee, not elsewhere classified: Secondary | ICD-10-CM | POA: Diagnosis not present

## 2023-07-09 DIAGNOSIS — M25662 Stiffness of left knee, not elsewhere classified: Secondary | ICD-10-CM | POA: Diagnosis not present

## 2023-07-09 DIAGNOSIS — M25562 Pain in left knee: Secondary | ICD-10-CM | POA: Diagnosis not present

## 2023-07-11 DIAGNOSIS — M25562 Pain in left knee: Secondary | ICD-10-CM | POA: Diagnosis not present

## 2023-07-11 DIAGNOSIS — M25662 Stiffness of left knee, not elsewhere classified: Secondary | ICD-10-CM | POA: Diagnosis not present

## 2023-07-12 DIAGNOSIS — M1711 Unilateral primary osteoarthritis, right knee: Secondary | ICD-10-CM | POA: Diagnosis not present

## 2023-07-18 DIAGNOSIS — M25562 Pain in left knee: Secondary | ICD-10-CM | POA: Diagnosis not present

## 2023-07-18 DIAGNOSIS — M25662 Stiffness of left knee, not elsewhere classified: Secondary | ICD-10-CM | POA: Diagnosis not present

## 2023-07-22 DIAGNOSIS — M25562 Pain in left knee: Secondary | ICD-10-CM | POA: Diagnosis not present

## 2023-07-22 DIAGNOSIS — M25662 Stiffness of left knee, not elsewhere classified: Secondary | ICD-10-CM | POA: Diagnosis not present

## 2023-07-24 DIAGNOSIS — M25662 Stiffness of left knee, not elsewhere classified: Secondary | ICD-10-CM | POA: Diagnosis not present

## 2023-07-24 DIAGNOSIS — M25562 Pain in left knee: Secondary | ICD-10-CM | POA: Diagnosis not present

## 2023-10-10 ENCOUNTER — Other Ambulatory Visit: Payer: Self-pay | Admitting: Orthopedic Surgery

## 2023-10-10 ENCOUNTER — Ambulatory Visit: Payer: Medicare Other

## 2023-10-10 ENCOUNTER — Ambulatory Visit
Admission: RE | Admit: 2023-10-10 | Discharge: 2023-10-10 | Disposition: A | Discharge status: Home / Self Care | Payer: Medicare Other | Source: Ambulatory Visit | Attending: Orthopedic Surgery | Admitting: Orthopedic Surgery

## 2023-10-10 DIAGNOSIS — R2241 Localized swelling, mass and lump, right lower limb: Secondary | ICD-10-CM | POA: Insufficient documentation

## 2023-12-03 ENCOUNTER — Other Ambulatory Visit: Payer: Self-pay | Admitting: Nurse Practitioner

## 2023-12-03 DIAGNOSIS — Z1231 Encounter for screening mammogram for malignant neoplasm of breast: Secondary | ICD-10-CM

## 2023-12-12 ENCOUNTER — Ambulatory Visit
Admission: RE | Admit: 2023-12-12 | Discharge: 2023-12-12 | Disposition: A | Discharge status: Home / Self Care | Source: Ambulatory Visit | Attending: Nurse Practitioner | Admitting: Nurse Practitioner

## 2023-12-12 DIAGNOSIS — Z1231 Encounter for screening mammogram for malignant neoplasm of breast: Secondary | ICD-10-CM | POA: Insufficient documentation

## 2023-12-20 LAB — COLOGUARD: COLOGUARD: NEGATIVE

## 2024-01-02 ENCOUNTER — Emergency Department

## 2024-01-02 ENCOUNTER — Other Ambulatory Visit: Payer: Self-pay

## 2024-01-02 DIAGNOSIS — M25561 Pain in right knee: Secondary | ICD-10-CM | POA: Insufficient documentation

## 2024-01-02 DIAGNOSIS — G8929 Other chronic pain: Secondary | ICD-10-CM | POA: Diagnosis not present

## 2024-01-02 DIAGNOSIS — Z96653 Presence of artificial knee joint, bilateral: Secondary | ICD-10-CM | POA: Diagnosis not present

## 2024-01-02 DIAGNOSIS — R0789 Other chest pain: Secondary | ICD-10-CM | POA: Insufficient documentation

## 2024-01-02 LAB — BASIC METABOLIC PANEL WITH GFR
Anion gap: 16 — ABNORMAL HIGH (ref 5–15)
BUN: 27 mg/dL — ABNORMAL HIGH (ref 8–23)
CO2: 21 mmol/L — ABNORMAL LOW (ref 22–32)
Calcium: 10.7 mg/dL — ABNORMAL HIGH (ref 8.9–10.3)
Chloride: 97 mmol/L — ABNORMAL LOW (ref 98–111)
Creatinine, Ser: 0.64 mg/dL (ref 0.44–1.00)
GFR, Estimated: >60 mL/min (ref >60)
Glucose, Bld: 171 mg/dL — ABNORMAL HIGH (ref 70–99)
Potassium: 3.9 mmol/L (ref 3.5–5.1)
Sodium: 134 mmol/L — ABNORMAL LOW (ref 135–145)

## 2024-01-02 LAB — CBC
HCT: 49.1 % — ABNORMAL HIGH (ref 36.0–46.0)
Hemoglobin: 17 g/dL — ABNORMAL HIGH (ref 12.0–15.0)
MCH: 28.6 pg (ref 26.0–34.0)
MCHC: 34.6 g/dL (ref 30.0–36.0)
MCV: 82.5 fL (ref 80.0–100.0)
Platelets: 470 10*3/uL — ABNORMAL HIGH (ref 150–400)
RBC: 5.95 MIL/uL — ABNORMAL HIGH (ref 3.87–5.11)
RDW: 13.1 % (ref 11.5–15.5)
WBC: 6.5 10*3/uL (ref 4.0–10.5)
nRBC: 0 % (ref 0.0–0.2)

## 2024-01-02 LAB — BRAIN NATRIURETIC PEPTIDE: B Natriuretic Peptide: 12 pg/mL (ref 0.0–100.0)

## 2024-01-02 LAB — TROPONIN I (HIGH SENSITIVITY)
Troponin I (High Sensitivity): 5 ng/L (ref <18)
Troponin I (High Sensitivity): 4 ng/L (ref <18)

## 2024-01-02 NOTE — ED Triage Notes (Signed)
 Pt reports she had right knee surgery 4 months ago and it is swollen and painful, states the pain radiates up and down her leg. She also reports chest pain and exertional shortness of breath that started today.  She also had left knee surgery 5 months ago but has had no issues with that knee.

## 2024-01-02 NOTE — ED Notes (Signed)
 BLUE TOP SENT TO THE LAB WITH THE OTHER BLOOD WORK - JUST IN CASE IT IS NEEDED

## 2024-01-02 NOTE — ED Notes (Signed)
 Reviewed pt's triage and results with Dr Peggi Bowels.Leah AasAaron AasAaron AasVerbal orders given to add on bnp and u/s for DVT

## 2024-01-03 ENCOUNTER — Emergency Department

## 2024-01-03 ENCOUNTER — Emergency Department
Admission: EM | Admit: 2024-01-03 | Discharge: 2024-01-03 | Disposition: A | Discharge status: Home / Self Care | Attending: Emergency Medicine | Admitting: Emergency Medicine

## 2024-01-03 DIAGNOSIS — R0789 Other chest pain: Secondary | ICD-10-CM

## 2024-01-03 DIAGNOSIS — G8929 Other chronic pain: Secondary | ICD-10-CM

## 2024-01-03 LAB — D-DIMER, QUANTITATIVE: D-Dimer, Quant: 0.8 ug{FEU}/mL — ABNORMAL HIGH (ref 0.00–0.50)

## 2024-01-03 MED ORDER — IOHEXOL 350 MG/ML SOLN
75.0000 mL | Freq: Once | INTRAVENOUS | Status: AC | PRN
  Administered 2024-01-03: 75 mL via INTRAVENOUS

## 2024-01-03 NOTE — Discharge Instructions (Signed)
 Your workup in the Emergency Department today was reassuring.  We did not find any specific abnormalities.  We recommend you drink plenty of fluids, take your regular medications and/or any new ones prescribed today, and follow up with the doctor(s) listed in these documents as recommended.  Return to the Emergency Department if you develop new or worsening symptoms that concern you. ------------------------------------------------------- Su evaluacin en Urgencias hoy fue tranquilizadora. No encontramos ninguna anomala especfica. Le recomendamos beber abundante lquido, tomar sus medicamentos habituales o los nuevos que le hayan recetado hoy, y acudir al/los mdico(s) de control indicado(s) en estos documentos, segn lo recomendado.  Regrese a Urgencias si presenta sntomas nuevos o que empeoran y [employer]

## 2024-01-03 NOTE — ED Notes (Signed)
 Pt is CAOx4, breathing normally, and normal in color. Pt is lying semi-fowlers in bed and complaining of right knee pain and swelling. Pt states that her leg has been swollen and hurting since her knee surgery x4 months ago. Pt's daughter at bedside with her at this time.

## 2024-01-03 NOTE — ED Provider Notes (Signed)
 [facility] Provider Note    Event Date/Time   First MD Initiated Contact with Patient 01/03/24 0014     (approximate)   History   Post-op Problem and Chest Pain  The patient and/or family speak(s) Spanish.  They understand they have the right to the use of a hospital interpreter, however at this time they prefer to speak directly with me in Spanish.  They know that they can ask for an interpreter at any time.   HPI Leah Harris is a 69 y.o. female who presents for evaluation of chronic right knee pain after orthopedic surgery at [facility] about 4 months ago.  She also states that earlier today she had chest pain and shortness of breath that occurred while and her leg was hurting her the worst.  She is also concerned because she had a left knee replacement surgery about 6 months ago and had no problems with it, but her right knee has continued to bother her ever since the surgery.  She states that she has pain every single day.  She has followed up at least once, including a visit last week, with [facility], and they gave her some pills to take to help with the inflammation but after taking the pills for her 6-day course the pain and the swelling is still present.  It is no worse than it has been for the last 4 months.  She has no history of heart problems nor blood clots in the legs or the lungs.     Physical Exam   Triage Vital Signs: ED Triage Vitals  Encounter Vitals Group     BP 01/02/24 1638 (!) 146/119     Systolic BP Percentile --      Diastolic BP Percentile --      Pulse Rate 01/02/24 1638 91     Resp 01/02/24 1638 18     Temp 01/02/24 1638 97.6 F (36.4 C)     Temp Source 01/02/24 1638 Oral     SpO2 01/02/24 1638 99 %     Weight 01/02/24 1636 63.5 kg (140 lb)     Height 01/02/24 1636 1.524 m (5')     Head Circumference --      Peak Flow --      Pain Score 01/02/24 1635 8     Pain Loc --      Pain Education --      Exclude from  Growth Chart --     Most recent vital signs: Vitals:   01/02/24 1959 01/03/24 0025  BP: 122/88 (!) 129/104  Pulse: 84 90  Resp: 18 16  Temp: 97.8 F (36.6 C) 98.1 F (36.7 C)  SpO2: 100% 100%    General: Awake, alert, no obvious distress. CV:  Good peripheral perfusion.  Regular rate and rhythm. Resp:  Normal effort. Speaking easily and comfortably, no accessory muscle usage nor intercostal retractions.  Lungs clear to auscultation bilaterally. Abd:  No distention.  Other:  No peripheral edema.  Patient has some mild swelling and mild erythema around the right knee which she says is normal for her.  Surgical scar is well-appearing.  Patient initially guards but with distraction I am able to passively flex and extend her leg without any obvious pain in the joint itself.   ED Results / Procedures / Treatments   Labs (all labs ordered are listed, but only abnormal results are displayed) Labs Reviewed  BASIC METABOLIC PANEL WITH GFR - Abnormal; Notable for the  following components:      Result Value   Sodium 134 (*)    Chloride 97 (*)    CO2 21 (*)    Glucose, Bld 171 (*)    BUN 27 (*)    Calcium 10.7 (*)    Anion gap 16 (*)    All other components within normal limits  CBC - Abnormal; Notable for the following components:   RBC 5.95 (*)    Hemoglobin 17.0 (*)    HCT 49.1 (*)    Platelets 470 (*)    All other components within normal limits  D-DIMER, QUANTITATIVE - Abnormal; Notable for the following components:   D-Dimer, Quant 0.80 (*)    All other components within normal limits  BRAIN NATRIURETIC PEPTIDE  TROPONIN I (HIGH SENSITIVITY)  TROPONIN I (HIGH SENSITIVITY)     EKG  ED ECG REPORT I, Lynnda Sas, the attending physician, personally viewed and interpreted this ECG.  Date: 01/02/2024 EKG Time: 16: 39 Rate: 89 Rhythm: normal sinus rhythm QRS Axis: normal Intervals: normal ST/T Wave abnormalities: normal Narrative Interpretation: no evidence of  acute ischemia    RADIOLOGY See ED course for details regarding CTA chest.  I viewed and interpreted the patient's right knee x-rays.  I can see the hardware from her recent surgery but no obvious effusion or bony/hardware abnormalities.  Radiologist confirms no acute findings.  I also viewed and interpreted the patient's venous ultrasound of the right leg.  I see no evidence of occlusion.  I also read the radiologist's report, which confirmed no acute findings.  I also viewed and interpreted the patient's two-view chest x-ray and I see no evidence of pneumonia, pneumothorax, nor other acute finding.  I also read the radiologist's report, which confirmed no acute findings.   PROCEDURES:  Critical Care performed: No  .1-3 Lead EKG Interpretation  Performed by: Lynnda Sas, MD Authorized by: Lynnda Sas, MD     Interpretation: normal     ECG rate:  88   ECG rate assessment: normal     Rhythm: sinus rhythm     Ectopy: none     Conduction: normal       IMPRESSION / MDM / ASSESSMENT AND PLAN / ED COURSE  I reviewed the triage vital signs and the nursing notes.                              Differential diagnosis includes, but is not limited to, chronic postoperative pain, pain causing transient shortness of breath, pneumonia, heart failure, PE.  Patient's presentation is most consistent with acute presentation with potential threat to life or bodily function.  Labs/studies ordered: EKG, two-view chest x-ray, right leg ultrasound, right knee x-rays, CTA chest, CBC, BNP, D-dimer, high-sensitivity troponin x 2, BMP  Interventions/Medications given:  Medications  iohexol (OMNIPAQUE) 350 MG/ML injection 75 mL (75 mLs Intravenous Contrast Given 01/03/24 0302)    (Note:  hospital course my include additional interventions and/or labs/studies not listed above.)   Patient's vitals are stable, diastolic pressure was little bit elevated but that may be positional.  Normal  high-sensitivity troponin x 2, normal imaging as documented above, labs are essentially normal except that a D-dimer was ordered while the patient was waiting in triage and it is elevated at 0.8, which is greater than can be attributed to age adjustment.  I think that the probability of PE is very low; I think that the patient was  having more acute pain in her right leg which caused her to feel short of breath.  However, with the elevated dimer and the report of some exertional dyspnea and chest pain, I feel obligated to rule out PE with a CTA chest.  I explained this to the patient.  However, I also explained to her that there are no acute abnormalities in her knee, and there is nothing else I can do in this setting.  There is no large effusion to drain and no evidence of a septic arthritis.  She had an appointment with orthopedics last week and I explained that she will need to follow-up with them to see if there is any additional evaluation or workup they can do.  But fortunately there is no evidence of an acute or emergent abnormality of the knee at this time.  She is able to flex and extend it and bear weight.  She and her family member said that they understand.  If the CTA chest shows no evidence of an emergent condition such as PE, anticipate discharge and outpatient orthopedics follow-up.  The patient is on the cardiac monitor to evaluate for evidence of arrhythmia and/or significant heart rate changes.   Clinical Course as of 01/03/24 0436  Fri Jan 03, 2024  0350 CT Angio Chest PE W/Cm &/Or Wo Cm I independently viewed and interpreted the patient's CTA chest and I see no evidence of PE nor pneumonia.  Radiology report confirms no PE, mentions mild bronchial wall thickening. [CF]  0436 Reassessed patient.  She is resting comfortably.  No respiratory distress, no ongoing chest pain.  Stable and appropriate for discharge and outpatient follow-up.  I gave my usual and customary return precautions.  [CF]    Clinical Course User Index [CF] Lynnda Sas, MD     FINAL CLINICAL IMPRESSION(S) / ED DIAGNOSES   Final diagnoses:  Chronic knee pain after total replacement of right knee joint  Atypical chest pain     Rx / DC Orders   ED Discharge Orders     None        Note:  This document was prepared using Dragon voice recognition software and may include unintentional dictation errors.   Lynnda Sas, MD 01/03/24 [phone]

## 2024-01-17 ENCOUNTER — Ambulatory Visit
Admission: RE | Admit: 2024-01-17 | Discharge: 2024-01-17 | Disposition: A | Discharge status: Home / Self Care | Source: Ambulatory Visit | Attending: Orthopedic Surgery | Admitting: Orthopedic Surgery

## 2024-01-17 ENCOUNTER — Other Ambulatory Visit: Payer: Self-pay | Admitting: Orthopedic Surgery

## 2024-01-17 DIAGNOSIS — Z96651 Presence of right artificial knee joint: Secondary | ICD-10-CM | POA: Diagnosis present

## 2024-01-17 DIAGNOSIS — M7989 Other specified soft tissue disorders: Secondary | ICD-10-CM | POA: Insufficient documentation

## 2024-02-06 ENCOUNTER — Emergency Department
Admission: EM | Admit: 2024-02-06 | Discharge: 2024-02-06 | Disposition: A | Discharge status: Home / Self Care | Attending: Emergency Medicine | Admitting: Emergency Medicine

## 2024-02-06 ENCOUNTER — Other Ambulatory Visit: Payer: Self-pay

## 2024-02-06 ENCOUNTER — Emergency Department

## 2024-02-06 DIAGNOSIS — R1030 Lower abdominal pain, unspecified: Secondary | ICD-10-CM | POA: Insufficient documentation

## 2024-02-06 LAB — COMPREHENSIVE METABOLIC PANEL WITH GFR
ALT: 24 U/L (ref 0–44)
AST: 31 U/L (ref 15–41)
Albumin: 4.1 g/dL (ref 3.5–5.0)
Alkaline Phosphatase: 66 U/L (ref 38–126)
Anion gap: 11 (ref 5–15)
BUN: 12 mg/dL (ref 8–23)
CO2: 22 mmol/L (ref 22–32)
Calcium: 9.5 mg/dL (ref 8.9–10.3)
Chloride: 104 mmol/L (ref 98–111)
Creatinine, Ser: 0.51 mg/dL (ref 0.44–1.00)
GFR, Estimated: >60 mL/min (ref >60)
Glucose, Bld: 152 mg/dL — ABNORMAL HIGH (ref 70–99)
Potassium: 3.5 mmol/L (ref 3.5–5.1)
Sodium: 137 mmol/L (ref 135–145)
Total Bilirubin: 0.8 mg/dL (ref 0.0–1.2)
Total Protein: 7.5 g/dL (ref 6.5–8.1)

## 2024-02-06 LAB — CBC
HCT: 43.9 % (ref 36.0–46.0)
Hemoglobin: 14.7 g/dL (ref 12.0–15.0)
MCH: 28.7 pg (ref 26.0–34.0)
MCHC: 33.5 g/dL (ref 30.0–36.0)
MCV: 85.6 fL (ref 80.0–100.0)
Platelets: 394 10*3/uL (ref 150–400)
RBC: 5.13 MIL/uL — ABNORMAL HIGH (ref 3.87–5.11)
RDW: 13.1 % (ref 11.5–15.5)
WBC: 5.5 10*3/uL (ref 4.0–10.5)
nRBC: 0 % (ref 0.0–0.2)

## 2024-02-06 LAB — URINALYSIS, ROUTINE W REFLEX MICROSCOPIC
Bilirubin Urine: NEGATIVE
Glucose, UA: NEGATIVE mg/dL
Hgb urine dipstick: NEGATIVE
Ketones, ur: NEGATIVE mg/dL
Nitrite: NEGATIVE
Protein, ur: NEGATIVE mg/dL
RBC / HPF: 0 RBC/hpf (ref 0–5)
Specific Gravity, Urine: 1.016 (ref 1.005–1.030)
pH: 6 (ref 5.0–8.0)

## 2024-02-06 LAB — LIPASE, BLOOD: Lipase: 43 U/L (ref 11–51)

## 2024-02-06 MED ORDER — HYDROMORPHONE HCL 1 MG/ML IJ SOLN
0.5000 mg | Freq: Once | INTRAMUSCULAR | Status: AC
  Administered 2024-02-06: 0.5 mg via INTRAVENOUS
  Filled 2024-02-06: qty 0.5

## 2024-02-06 MED ORDER — GABAPENTIN 100 MG PO CAPS: 100.0000 mg | ORAL_CAPSULE | Freq: Three times a day (TID) | ORAL | 2 refills | Status: AC

## 2024-02-06 MED ORDER — IOHEXOL 300 MG/ML  SOLN
100.0000 mL | Freq: Once | INTRAMUSCULAR | Status: AC | PRN
  Administered 2024-02-06: 100 mL via INTRAVENOUS

## 2024-02-06 NOTE — ED Provider Notes (Signed)
 [facility] Provider Note   Event Date/Time   First MD Initiated Contact with Patient 02/06/24 1114     (approximate) History  Abdominal Pain  HPI Leah Harris is a 68 y.o. female with a past medical history of multiple abdominal surgeries including a left inguinal hernia repair recently, abdominal hysterectomy, and bladder surgery who presents complaining of bilateral lower abdominal quadrant pain that has been present over the last 8-10 months.  Patient states that she was told by her primary care physician to present to the emergency department for a CT scan given her previous abdominal surgeries.  Patient describes a 10/10 pain in bilateral lower abdominal quadrants is worse on the right and radiates around to her back.  Patient endorses worsening with palpation on the right lower quadrant. ROS: Patient currently denies any vision changes, tinnitus, difficulty speaking, facial droop, sore throat, chest pain, shortness of breath, nausea/vomiting/diarrhea, dysuria, or weakness/numbness/paresthesias in any extremity   Physical Exam  Triage Vital Signs: ED Triage Vitals  Encounter Vitals Group     BP 02/06/24 0959 112/80     Girls Systolic BP Percentile --      Girls Diastolic BP Percentile --      Boys Systolic BP Percentile --      Boys Diastolic BP Percentile --      Pulse Rate 02/06/24 0959 74     Resp 02/06/24 0959 17     Temp 02/06/24 0959 98.6 F (37 C)     Temp Source 02/06/24 0959 Oral     SpO2 02/06/24 0959 100 %     Weight 02/06/24 1003 140 lb (63.5 kg)     Height 02/06/24 1003 5' (1.524 m)     Head Circumference --      Peak Flow --      Pain Score 02/06/24 1001 7     Pain Loc --      Pain Education --      Exclude from Growth Chart --    Most recent vital signs: Vitals:   02/06/24 1631 02/06/24 1640  BP: 131/86   Pulse: (!) 58   Resp: 16   Temp:  98.7 F (37.1 C)  SpO2: 98%    General: Awake, oriented x4. CV:  Good  peripheral perfusion. Resp:  Normal effort. Abd:  No distention.  Right lower quadrant tenderness to palpation Other:  Elderly overweight Hispanic female resting comfortably in no acute distress ED Results / Procedures / Treatments  Labs (all labs ordered are listed, but only abnormal results are displayed) Labs Reviewed  COMPREHENSIVE METABOLIC PANEL WITH GFR - Abnormal; Notable for the following components:      Result Value   Glucose, Bld 152 (*)    All other components within normal limits  CBC - Abnormal; Notable for the following components:   RBC 5.13 (*)    All other components within normal limits  URINALYSIS, ROUTINE W REFLEX MICROSCOPIC - Abnormal; Notable for the following components:   Color, Urine YELLOW (*)    APPearance CLEAR (*)    Leukocytes,Ua TRACE (*)    Bacteria, UA RARE (*)    All other components within normal limits  LIPASE, BLOOD   RADIOLOGY ED MD interpretation: CT of abdomen pelvis with IV contrast shows bilateral nonobstructive nephrolithiasis as well as aortic atherosclerosis without any other acute intra-abdominal findings - All radiology independently interpreted and agree with radiology assessment Official radiology report(s): CT ABDOMEN PELVIS W CONTRAST Result Date: 02/06/2024 CLINICAL  DATA:  Lower abdominal and back pain for several months. EXAM: CT ABDOMEN AND PELVIS WITH CONTRAST TECHNIQUE: Multidetector CT imaging of the abdomen and pelvis was performed using the standard protocol following bolus administration of intravenous contrast. RADIATION DOSE REDUCTION: This exam was performed according to the departmental dose-optimization program which includes automated exposure control, adjustment of the mA and/or kV according to patient size and/or use of iterative reconstruction technique. CONTRAST:  OMNIPAQUE  IOHEXOL  300 MG/ML  SOLN COMPARISON:  None Available. FINDINGS: Lower chest: No acute abnormality. Hepatobiliary: No focal liver abnormality  is seen. No gallstones, gallbladder wall thickening, or biliary dilatation. Pancreas: Unremarkable. No pancreatic ductal dilatation or surrounding inflammatory changes. Spleen: Normal in size without focal abnormality. Adrenals/Urinary Tract: Adrenal glands are unremarkable. Bilateral nonobstructive nephrolithiasis is noted. No hydronephrosis or renal obstruction is noted. Bladder is unremarkable. Stomach/Bowel: Stomach is within normal limits. Appendix appears normal. No evidence of bowel wall thickening, distention, or inflammatory changes. Vascular/Lymphatic: Aortic atherosclerosis. No enlarged abdominal or pelvic lymph nodes. Reproductive: Status post hysterectomy. No adnexal masses. Other: No ascites or hernia is noted. Musculoskeletal: No acute or significant osseous findings. IMPRESSION: Bilateral nonobstructive nephrolithiasis. Aortic Atherosclerosis (ICD10-I70.0). Electronically Signed   By: Rosalene Colon M.D.   On: 02/06/2024 15:21   PROCEDURES: Critical Care performed: No Procedures MEDICATIONS ORDERED IN ED: Medications  HYDROmorphone  (DILAUDID ) injection 0.5 mg (0.5 mg Intravenous Given 02/06/24 1231)  iohexol  (OMNIPAQUE ) 300 MG/ML solution 100 mL (100 mLs Intravenous Contrast Given 02/06/24 1441)   IMPRESSION / MDM / ASSESSMENT AND PLAN / ED COURSE  I reviewed the triage vital signs and the nursing notes.                             The patient is on the cardiac monitor to evaluate for evidence of arrhythmia and/or significant heart rate changes. Patient's presentation is most consistent with acute presentation with potential threat to life or bodily function. Patient's symptoms not typical for emergent causes of abdominal pain such as, but not limited to, appendicitis, abdominal aortic aneurysm, surgical biliary disease, pancreatitis, SBO, mesenteric ischemia, serious intra-abdominal bacterial illness. Presentation also not typical of gynecologic emergencies such as TOA, Ovarian  Torsion, PID. Not Ectopic. Doubt atypical ACS.  Pt tolerating PO. Disposition: Patient will be discharged with strict return precautions and follow up with primary MD within 12-24 hours for further evaluation. Patient understands that this still may have an early presentation of an emergent medical condition such as appendicitis that will require a recheck.   FINAL CLINICAL IMPRESSION(S) / ED DIAGNOSES   Final diagnoses:  Lower abdominal pain   Rx / DC Orders   ED Discharge Orders          Ordered    gabapentin (NEURONTIN) 100 MG capsule  3 times daily        02/06/24 1603           Note:  This document was prepared using Dragon voice recognition software and may include unintentional dictation errors.   Charleen Conn, MD 02/06/24 [phone]

## 2024-02-06 NOTE — ED Notes (Signed)
 Patient states she drove, but can call for a ride if needed. Patient requested to have the Dilaudid  and would wait for a ride if discharged before it's safe to drive.

## 2024-02-06 NOTE — ED Notes (Signed)
 Patient asked to speak with Dr. Alejo Amsler prior to discharge.

## 2024-02-06 NOTE — ED Notes (Signed)
 Instructions were also given to patient's daughter via telephone who speaks [place].

## 2024-02-06 NOTE — ED Triage Notes (Signed)
 Pt to ED via POV from home. Pt reports lower abd pain with radiation to back x8-10months. . Pt reports pain has been constant. Pt denies N/V/D. Pt states was told by MD on Monday she needs to have surgery so can come to the ED or wait 1 month to be seen in office.

## 2024-02-06 NOTE — ED Notes (Signed)
 Patient taken to CT scan.

## 2024-03-11 ENCOUNTER — Encounter: Payer: Self-pay | Admitting: Urology

## 2024-03-11 ENCOUNTER — Ambulatory Visit: Admitting: Urology

## 2024-03-11 VITALS — BP 117/72 | HR 76 | Ht 60.0 in | Wt 140.0 lb

## 2024-03-11 DIAGNOSIS — N200 Calculus of kidney: Secondary | ICD-10-CM | POA: Diagnosis not present

## 2024-03-11 DIAGNOSIS — R1030 Lower abdominal pain, unspecified: Secondary | ICD-10-CM

## 2024-03-11 LAB — URINALYSIS, COMPLETE
Bilirubin, UA: NEGATIVE
Glucose, UA: NEGATIVE
Ketones, UA: NEGATIVE
Leukocytes,UA: NEGATIVE
Nitrite, UA: NEGATIVE
Protein,UA: NEGATIVE
RBC, UA: NEGATIVE
Specific Gravity, UA: 1.02 (ref 1.005–1.030)
Urobilinogen, Ur: 0.2 mg/dL (ref 0.2–1.0)
pH, UA: 6 (ref 5.0–7.5)

## 2024-03-11 LAB — MICROSCOPIC EXAMINATION: Mucus, UA: NONE SEEN

## 2024-03-11 LAB — BLADDER SCAN AMB NON-IMAGING: Scan Result: 59

## 2024-03-11 NOTE — Progress Notes (Signed)
 I, Maysun LITTIE Griffiths, acting as a scribe for Glendia JAYSON Barba, MD., have documented all relevant documentation on the behalf of Glendia JAYSON Barba, MD, as directed by Glendia JAYSON Barba, MD while in the presence of Glendia JAYSON Barba, MD.  03/11/2024 5:18 PM   Leah Harris 1955-12-05 [id number]  Chief Complaint  Patient presents with   Abdominal Pain    HPI: Leah Harris is a 68 y.o. female presents in follow-up of recent ED visit for abdominal pain. A [place] Spanish interpreter was present during this visit.   Presented to [facility] ED 02/06/2024 complaining of an 8-10 month history of severe bilateral lower abdominal pain radiating to the right lower back. Pain was described at 10/10. CT of the abdomen pelvis was performed which was felt to show small, bilateral non-obstructing renal calculi. Her urinalysis was unremarkable.  Patient states she was told by the ED that her stones were causing her pain, however on review with ED records, this does not appear to be the case and there is no mention of her caculai being the source of her pain. She has a prior history of vaginal vault prolapse repaired by urogynecology at [facility]. Denies grossing hematuria. She has occasional dysuria and occasional urinary hesitancy.   PMH: Past Medical History:  Diagnosis Date   Diabetes (HCC)    Hyperlipidemia    Inguinal hernia    Osteoarthritis    Vaginal prolapse    Wears dentures    Partial upper and lower    Surgical History: Past Surgical History:  Procedure Laterality Date   ABDOMINAL HYSTERECTOMY  2014   AUGMENTATION MAMMAPLASTY Bilateral 2008   saline   AUGMENTATION MAMMAPLASTY Bilateral 11/2020   had implants replaced   BUNIONECTOMY Left 09/2021   CATARACT EXTRACTION W/PHACO Right 04/02/2023   Procedure: CATARACT EXTRACTION PHACO AND INTRAOCULAR LENS PLACEMENT (IOC) RIGHT DIABETIC VISION BLUE 5.06 00:36.4;  Surgeon: Jaye Fallow, MD;  Location: [facility];  Service:  Ophthalmology;  Laterality: Right;   CATARACT EXTRACTION W/PHACO Left 04/16/2023   Procedure: CATARACT EXTRACTION PHACO AND INTRAOCULAR LENS PLACEMENT (IOC) LEFT DIABETIC VISION BLUE 4.01 00:31.5;  Surgeon: Jaye Fallow, MD;  Location: [facility];  Service: Ophthalmology;  Laterality: Left;   COLPORRHAPHY  12/17/2022   CYSTOURETHROSCOPY  12/17/2022   ROBOTIC ASSISTED LAPAROSCOPIC LYSIS OF ADHESION  12/17/2022    Home Medications:  Allergies as of 03/11/2024   No Known Allergies      Medication List        Accurate as of March 11, 2024  5:18 PM. If you have any questions, ask your nurse or doctor.          acetaminophen  500 MG tablet Commonly known as: TYLENOL  Take by mouth.   escitalopram 10 MG tablet Commonly known as: LEXAPRO Take 10 mg by mouth daily.   gabapentin  100 MG capsule Commonly known as: Neurontin  Take 1 capsule (100 mg total) by mouth 3 (three) times daily.   hydrOXYzine 25 MG tablet Commonly known as: ATARAX Take 25 mg by mouth at bedtime as needed.   MAGNESIUM PO Take 525 mg by mouth daily.   metFORMIN 500 MG tablet Commonly known as: GLUCOPHAGE Take 500 mg by mouth 2 (two) times daily.   multivitamin tablet Take 1 tablet by mouth daily.   oxyCODONE -acetaminophen  5-325 MG tablet Commonly known as: Percocet Take 1 tablet by mouth every 4 (four) hours as needed for severe pain.   Salicylic Acid  17.6 % Liqd Apply 1  drop topically daily.   Sodium Fluoride 5000 Enamel 1.1-5 % Gel Generic drug: Sod Fluoride-Potassium Nitrate Take by mouth.   traZODone 50 MG tablet Commonly known as: DESYREL Take 50 mg by mouth at bedtime as needed for sleep.   Vitamin D3 25 MCG (1000 UT) Caps Take 1,000 Units by mouth daily.        Allergies: No Known Allergies  Family History: Family History  Problem Relation Age of Onset   Hypertension Mother    Asthma Mother        Deceased   Heart disease Father        Deceased   Healthy Child          x 5   Thyroid  disease Daughter    Breast cancer Neg Hx     Social History:  reports that she has never smoked. She has never been exposed to tobacco smoke. She has never used smokeless tobacco. She reports that she does not drink alcohol and does not use drugs.   Physical Exam: BP 117/72   Pulse 76   Ht 5' (1.524 m)   Wt 140 lb (63.5 kg)   LMP  (LMP Unknown)   BMI 27.34 kg/m   Constitutional:  Alert and oriented, No acute distress. HEENT: [employer] AT, moist mucus membranes.  Trachea midline, no masses. Cardiovascular: No clubbing, cyanosis, or edema. Respiratory: Normal respiratory effort, no increased work of breathing. GI: Abdomen is soft, nontender, nondistended, no abdominal masses Skin: No rashes, bruises or suspicious lesions. Neurologic: Grossly intact, no focal deficits, moving all 4 extremities. Psychiatric: Normal mood and affect.   Urinalysis Dipstick/microscopy negative   Pertinent Imaging: CT abdomen/pelvis performed [facility] 02/06/2024 was personally reviewed and interpreted. No ureteral calculi or hydropnephrosis noted. I am not convinced that she has small, bilateral urenal calculi, as this may represent contrast.   CT  EXAM: CT ABDOMEN AND PELVIS WITH CONTRAST   TECHNIQUE: Multidetector CT imaging of the abdomen and pelvis was performed using the standard protocol following bolus administration of intravenous contrast.   RADIATION DOSE REDUCTION: This exam was performed according to the departmental dose-optimization program which includes automated exposure control, adjustment of the mA and/or kV according to patient size and/or use of iterative reconstruction technique.   CONTRAST:  100mL OMNIPAQUE  IOHEXOL  300 MG/ML  SOLN   COMPARISON:  None Available.   FINDINGS: Lower chest: No acute abnormality.   Hepatobiliary: No focal liver abnormality is seen. No gallstones, gallbladder wall thickening, or biliary dilatation.   Pancreas: Unremarkable. No  pancreatic ductal dilatation or surrounding inflammatory changes.   Spleen: Normal in size without focal abnormality.   Adrenals/Urinary Tract: Adrenal glands are unremarkable. Bilateral nonobstructive nephrolithiasis is noted. No hydronephrosis or renal obstruction is noted. Bladder is unremarkable.   Stomach/Bowel: Stomach is within normal limits. Appendix appears normal. No evidence of bowel wall thickening, distention, or inflammatory changes.   Vascular/Lymphatic: Aortic atherosclerosis. No enlarged abdominal or pelvic lymph nodes.   Reproductive: Status post hysterectomy. No adnexal masses.   Other: No ascites or hernia is noted.   Musculoskeletal: No acute or significant osseous findings.   IMPRESSION: Bilateral nonobstructive nephrolithiasis.   Aortic Atherosclerosis (ICD10-I70.0).     Electronically Signed   By: Lynwood Landy Raddle M.D.   On: 02/06/2024 15:21  Assessment & Plan:    1. Abdominal pain CT was reviewed and urinalysis was normal PVR today 59 mL We discussed there is no evidence of a urologic source of her pain.  We  discussed her condition even if the small calcifications seen on CT represent urinary calculei, they are small, non-obstructing, and would not be a source of her pain.  Recommend PCP follow-up for a further evaluation of her pain.  I have reviewed the above documentation for accuracy and completeness, and I agree with the above.   Glendia JAYSON Barba, MD  [facility] [address] [place], [place] [address] [phone]

## 2024-03-12 LAB — COLOGUARD: COLOGUARD: NEGATIVE

## 2024-08-05 ENCOUNTER — Telehealth: Payer: Self-pay

## 2024-08-05 NOTE — Telephone Encounter (Signed)
 Patient called and left me a voicemail letting me know that she was calling us  to schedule her colonoscopy. However, when I looked into her chart to call her and set it up for her, the referral that we had was from June of last year. Therefore, it had expired. I called her and left her a voicemail in Spanish letting her know that she is to contact her PCP (Dr. Antone) and tell him that we are needing a new colonoscopy referral in order for us  to call her back.
